# Patient Record
Sex: Female | Born: 2000 | Hispanic: Yes | Marital: Single | Smoking: Never smoker
Health system: Southern US, Community
[De-identification: ages and names within clinical notes are randomized; demographics above are authoritative.]

## PROBLEM LIST (undated history)

## (undated) DIAGNOSIS — I10 Essential (primary) hypertension: Secondary | ICD-10-CM

## (undated) DIAGNOSIS — F329 Major depressive disorder, single episode, unspecified: Secondary | ICD-10-CM

## (undated) DIAGNOSIS — F32A Depression, unspecified: Secondary | ICD-10-CM

## (undated) DIAGNOSIS — F4310 Post-traumatic stress disorder, unspecified: Secondary | ICD-10-CM

## (undated) HISTORY — DX: Essential (primary) hypertension: I10

## (undated) HISTORY — DX: Post-traumatic stress disorder, unspecified: F43.10

## (undated) HISTORY — DX: Depression, unspecified: F32.A

## (undated) HISTORY — PX: NO PAST SURGERIES: SHX2092

---
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
## 2019-01-21 NOTE — Progress Notes (Signed)
Chart abstracted per 01/20/19 phone interview with Tawanna Solo, RN; Debera Lat, RN

## 2019-01-24 ENCOUNTER — Ambulatory Visit (LOCAL_COMMUNITY_HEALTH_CENTER): Payer: Medicaid Other | Admitting: Family Medicine

## 2019-01-24 ENCOUNTER — Encounter: Payer: Self-pay | Admitting: Family Medicine

## 2019-01-24 ENCOUNTER — Other Ambulatory Visit: Payer: Self-pay

## 2019-01-24 VITALS — BP 131/67 | HR 87 | Temp 99.1°F | Wt 145.6 lb

## 2019-01-24 DIAGNOSIS — F329 Major depressive disorder, single episode, unspecified: Secondary | ICD-10-CM

## 2019-01-24 DIAGNOSIS — O219 Vomiting of pregnancy, unspecified: Secondary | ICD-10-CM | POA: Diagnosis not present

## 2019-01-24 DIAGNOSIS — O99340 Other mental disorders complicating pregnancy, unspecified trimester: Secondary | ICD-10-CM

## 2019-01-24 DIAGNOSIS — N926 Irregular menstruation, unspecified: Secondary | ICD-10-CM

## 2019-01-24 DIAGNOSIS — Z3400 Encounter for supervision of normal first pregnancy, unspecified trimester: Secondary | ICD-10-CM | POA: Diagnosis not present

## 2019-01-24 DIAGNOSIS — F32A Depression, unspecified: Secondary | ICD-10-CM | POA: Insufficient documentation

## 2019-01-24 LAB — URINALYSIS
Bilirubin, UA: NEGATIVE
Glucose, UA: NEGATIVE
Ketones, UA: NEGATIVE
Nitrite, UA: NEGATIVE
RBC, UA: NEGATIVE
Specific Gravity, UA: 1.03 (ref 1.005–1.030)
Urobilinogen, Ur: 0.2 mg/dL (ref 0.2–1.0)
pH, UA: 7 (ref 5.0–7.5)

## 2019-01-24 LAB — OB RESULTS CONSOLE HIV ANTIBODY (ROUTINE TESTING): HIV: NONREACTIVE

## 2019-01-24 LAB — HEMOGLOBIN, FINGERSTICK: Hemoglobin: 12 g/dL (ref 11.1–15.9)

## 2019-01-24 MED ORDER — PROMETHAZINE HCL 25 MG PO TABS: 25.0000 mg | ORAL_TABLET | Freq: Four times a day (QID) | ORAL | 0 refills | Status: DC | PRN

## 2019-01-24 MED ORDER — PROMETHAZINE HCL 25 MG PO TABS: 25.0000 mg | ORAL_TABLET | Freq: Four times a day (QID) | ORAL | 1 refills | Status: DC | PRN

## 2019-01-24 NOTE — Progress Notes (Signed)
In for new ob; has PNV; c/o nausea; agrees to flu vaccine today; initial labs today Debera Lat, RN Hgb WNL-informed will be called with u/s appt Debera Lat, RN Referred to [place], RN

## 2019-01-24 NOTE — Progress Notes (Signed)
[place] [id number] [phone]  INITIAL PRENATAL VISIT NOTE  Subjective:  [name] Black is a 18 y.o. G1P0 at [redacted]w[redacted]d being seen today to start prenatal care at the [facility] Department.  She is currently monitored for the following issues for this low-risk pregnancy and has Intentional drug overdose [facility]); MDD (major depressive disorder), single episode, severe , no psychosis ([place]); PTSD (post-traumatic stress disorder); Depression affecting pregnancy, antepartum; Supervision of normal first pregnancy, antepartum; and Nausea and vomiting during pregnancy on their problem list.  Patient reports nausea and vomiting.  Contractions: Not present. Vag. Bleeding: None.  Movement: Absent. Denies leaking of fluid.   The following portions of the patient's history were reviewed and updated as appropriate: allergies, current medications, past family history, past medical history, past social history, past surgical history and problem list. Problem list updated.  Objective:   Vitals:   01/24/19 1407  BP: 131/67  Pulse: 87  Temp: 99.1 F (37.3 C)  Weight: 145 lb 9.6 oz (66 kg)    Fetal Status: Fetal Heart Rate (bpm): 145 Fundal Height: 12 cm Movement: Absent     Physical Exam Vitals signs and nursing note reviewed.  Constitutional:      General: She is not in acute distress.    Appearance: Normal appearance. She is well-developed.  HENT:     Head: Normocephalic and atraumatic.     Right Ear: External ear normal.     Left Ear: External ear normal.     Nose: Nose normal. No congestion or rhinorrhea.     Mouth/Throat:     Lips: Pink.     Mouth: Mucous membranes are moist.     Dentition: Normal dentition. No dental caries.  Eyes:     General: No scleral icterus.    Conjunctiva/sclera: Conjunctivae normal.  Neck:     Thyroid: No thyroid mass or thyromegaly.  Cardiovascular:      Rate and Rhythm: Normal rate.     Pulses: Normal pulses.     Comments: Extremities are warm and well perfused Pulmonary:     Effort: Pulmonary effort is normal.     Breath sounds: Normal breath sounds.  Chest:     Breasts: Breasts are symmetrical.        Right: Normal. No mass, nipple discharge or skin change.        Left: Normal. No mass, nipple discharge or skin change.  Abdominal:     General: Abdomen is flat.     Palpations: Abdomen is soft.     Tenderness: There is no abdominal tenderness.     Comments: Gravid   Genitourinary:    General: Normal vulva.     Exam position: Lithotomy position.     Pubic Area: No rash.      Labia:        Right: No rash.        Left: No rash.      Vagina: Normal. No vaginal discharge.     Cervix: No cervical motion tenderness or friability.     Uterus: Normal. Enlarged (Gravid 12 wk). Not tender.      Adnexa: Right adnexa normal and left adnexa normal.     Rectum: Normal. No external hemorrhoid.  Musculoskeletal:     Right lower leg: No edema.     Left lower leg: No edema.  Lymphadenopathy:     Upper Body:     Right  upper body: No axillary adenopathy.     Left upper body: No axillary adenopathy.  Skin:    General: Skin is warm.     Capillary Refill: Capillary refill takes less than 2 seconds.  Neurological:     Mental Status: She is alert.     Assessment and Plan:  Pregnancy: G1P0 at [redacted]w[redacted]d  1. Supervision of normal first pregnancy, antepartum Reviewed genetic screenings- desires QUAD Last Dental visit was within the past year Excited about pregnancy, FOB came into room for FHT - Prenatal profile without Varicella/Rubella ([id number]) - QuantiFERON-TB Gold Plus - Lead, blood (adult age 44 yrs or greater) - Urine Culture - HIV [place] [facility] - Chlamydia/GC NAA, Confirmation - HGB FRAC. W/SOLUBILITY - promethazine (PHENERGAN) 25 MG tablet; Take 1 tablet (25 mg total) by mouth every 6 (six) hours as needed for nausea or vomiting.   Dispense: 30 tablet; Refill: 1  2. Depression affecting pregnancy, antepartum Happy today, recommend monitoring mood closely Low risk PHQ9 today (Score =1) Low threshold for referral to LCSW and recommend involvement early in pregnancy to help link to care if worsening mood pp.   3. Irregular periods/menstrual cycles - [place] MFM OB Comp Less 14 Wks; Future  4. Nausea and vomiting during pregnancy Phenergan #15  Recommended OTC meds for nausea Given [facility] supply of phenergan   Discussed overview of care and coordination with inpatient delivery practices including [facility], [facility], [facility] and [facility].   Reviewed Centering pregnancy as standard of care at [facility], oriented to room and showed video. Based on EDD, cycle 77-- continue to discuss enrollment. .    Preterm labor symptoms and general obstetric precautions including but not limited to vaginal bleeding, contractions, leaking of fluid and fetal movement were reviewed in detail with the patient.  Please refer to After Visit Summary for other counseling recommendations.   Return in about 4 weeks (around 02/21/2019) for Routine prenatal care.  Future Appointments  Date Time Provider Department Center  02/21/2019  2:00 PM [facility] PROVIDER [facility] None    Federico Flake, MD

## 2019-01-25 LAB — LEAD, BLOOD (ADULT >= 16 YRS): Lead-Whole Blood: <1 ug/dL (ref 0–4)

## 2019-01-26 ENCOUNTER — Encounter: Payer: Self-pay | Admitting: Advanced Practice Midwife

## 2019-01-26 DIAGNOSIS — O2340 Unspecified infection of urinary tract in pregnancy, unspecified trimester: Secondary | ICD-10-CM

## 2019-01-26 HISTORY — DX: Unspecified infection of urinary tract in pregnancy, unspecified trimester: O23.40

## 2019-01-26 LAB — URINE CULTURE

## 2019-01-27 LAB — CBC/D/PLT+RPR+RH+ABO+AB SCR
Antibody Screen: NEGATIVE
Basophils Absolute: 0 10*3/uL (ref 0.0–0.2)
Basos: 0 %
EOS (ABSOLUTE): 0.1 10*3/uL (ref 0.0–0.4)
Eos: 1 %
Hematocrit: 35.1 % (ref 34.0–46.6)
Hemoglobin: 12.2 g/dL (ref 11.1–15.9)
Hepatitis B Surface Ag: NEGATIVE
Immature Grans (Abs): 0 10*3/uL (ref 0.0–0.1)
Immature Granulocytes: 0 %
Lymphocytes Absolute: 1.7 10*3/uL (ref 0.7–3.1)
Lymphs: 19 %
MCH: 29.5 pg (ref 26.6–33.0)
MCHC: 34.8 g/dL (ref 31.5–35.7)
MCV: 85 fL (ref 79–97)
Monocytes Absolute: 0.6 10*3/uL (ref 0.1–0.9)
Monocytes: 6 %
Neutrophils Absolute: 6.6 10*3/uL (ref 1.4–7.0)
Neutrophils: 74 %
Platelets: 201 10*3/uL (ref 150–450)
RBC: 4.13 x10E6/uL (ref 3.77–5.28)
RDW: 12.3 % (ref 11.7–15.4)
RPR Ser Ql: NONREACTIVE
Rh Factor: POSITIVE
WBC: 9 10*3/uL (ref 3.4–10.8)

## 2019-01-27 LAB — QUANTIFERON-TB GOLD PLUS
QuantiFERON Mitogen Value: 3.71 IU/mL
QuantiFERON Nil Value: 0.38 IU/mL
QuantiFERON TB1 Ag Value: 4.23 IU/mL
QuantiFERON TB2 Ag Value: 4.38 IU/mL
QuantiFERON-TB Gold Plus: POSITIVE — AB

## 2019-01-27 LAB — HGB FRAC. W/SOLUBILITY
Hgb A2 Quant: 2.3 % (ref 1.8–3.2)
Hgb A: 97.7 % (ref 96.4–98.8)
Hgb C: 0 %
Hgb F Quant: 0 % (ref 0.0–2.0)
Hgb S: 0 %
Hgb Solubility: NEGATIVE
Hgb Variant: 0 %

## 2019-01-28 ENCOUNTER — Telehealth: Payer: Self-pay

## 2019-01-28 ENCOUNTER — Ambulatory Visit: Payer: Medicaid Other | Admitting: Advanced Practice Midwife

## 2019-01-28 ENCOUNTER — Other Ambulatory Visit: Payer: Self-pay

## 2019-01-28 VITALS — BP 137/87 | Temp 97.5°F | Wt 147.8 lb

## 2019-01-28 DIAGNOSIS — O2341 Unspecified infection of urinary tract in pregnancy, first trimester: Secondary | ICD-10-CM | POA: Diagnosis not present

## 2019-01-28 DIAGNOSIS — T50902A Poisoning by unspecified drugs, medicaments and biological substances, intentional self-harm, initial encounter: Secondary | ICD-10-CM

## 2019-01-28 DIAGNOSIS — F329 Major depressive disorder, single episode, unspecified: Secondary | ICD-10-CM

## 2019-01-28 DIAGNOSIS — Z3400 Encounter for supervision of normal first pregnancy, unspecified trimester: Secondary | ICD-10-CM | POA: Diagnosis not present

## 2019-01-28 DIAGNOSIS — O99340 Other mental disorders complicating pregnancy, unspecified trimester: Secondary | ICD-10-CM

## 2019-01-28 DIAGNOSIS — F32A Depression, unspecified: Secondary | ICD-10-CM

## 2019-01-28 LAB — CHLAMYDIA/GC NAA, CONFIRMATION
Chlamydia trachomatis, NAA: NEGATIVE
Neisseria gonorrhoeae, NAA: NEGATIVE

## 2019-01-28 MED ORDER — NITROFURANTOIN MONOHYD MACRO 100 MG PO CAPS: 100.0000 mg | ORAL_CAPSULE | Freq: Two times a day (BID) | ORAL | 0 refills | Status: AC

## 2019-01-28 NOTE — Telephone Encounter (Signed)
Call to client and counseled regarding need for UTI treatment. Appt scheduled for this pm in [facility]. Rich Number, RN

## 2019-01-28 NOTE — Progress Notes (Signed)
   PRENATAL VISIT NOTE  Subjective:  [name] Black is a 18 y.o. G1P0 at [redacted]w[redacted]d being seen today for ongoing prenatal care.  She is currently monitored for the following issues for this low-risk pregnancy and has Intentional drug overdose [facility]); MDD (major depressive disorder), single episode, severe , no psychosis ([place]); PTSD (post-traumatic stress disorder); Depression affecting pregnancy, antepartum; Supervision of normal first pregnancy, antepartum; Nausea and vomiting during pregnancy; and UTI (urinary tract infection) during pregnancy 01/24/19 E. Coli 10-25,000 on their problem list.  Patient reports no complaints.  Contractions: Not present. Vag. Bleeding: None.  Movement: Absent. Denies leaking of fluid/ROM.   The following portions of the patient's history were reviewed and updated as appropriate: allergies, current medications, past family history, past medical history, past social history, past surgical history and problem list. Problem list updated.  Objective:   Vitals:   01/28/19 1454  BP: 137/87  Temp: (!) 97.5 F (36.4 C)  Weight: 147 lb 12.8 oz (67 kg)    Fetal Status: Fetal Heart Rate (bpm): 160   Movement: Absent     General:  Alert, oriented and cooperative. Patient is in no acute distress.  Skin: Skin is warm and dry. No rash noted.   Cardiovascular: Normal heart rate noted  Respiratory: Normal respiratory effort, no problems with respiration noted  Abdomen: Soft, gravid, appropriate for gestational age.  Pain/Pressure: Absent     Pelvic: Cervical exam deferred        Extremities: Normal range of motion.  Edema: None  Mental Status: Normal mood and affect. Normal behavior. Normal judgment and thought content.   Assessment and Plan:  Pregnancy: G1P0 at [redacted]w[redacted]d  1. Depression affecting pregnancy, antepartum States at age 35 when had drug OD on mom's pills and was taken to ER, admitted x 5 days and then transferred to inpatient facility x 2 wks Did receive therapy  at age 6.  Declines need for therapy at present, but will let us know if needs.  Denies cigs.  Last MJ early 2020.  2. Urinary tract infection in mother during first trimester of pregnancy -CVAT.  Denies daily caffeine.  Given Macrobid 100 mg po BID x 7 days with instructions.   - nitrofurantoin, macrocrystal-monohydrate, (MACROBID) 100 MG capsule; Take 1 capsule (100 mg total) by mouth 2 (two) times daily for 7 days.  Dispense: 14 capsule; Refill: 0  3. Supervision of normal first pregnancy, antepartum Reminded of 01/31/19 u/s.  Not working.  Senior at [employer].  Living with FOB and his mom and mom's partner and 2 yo sister.  4. Intentional drug overdose, initial encounter ([place]) OD age 37 on mom's pills and in patient Last MJ early 2020.  Agrees to UDS today - [id number] Drug Screen   Preterm labor symptoms and general obstetric precautions including but not limited to vaginal bleeding, contractions, leaking of fluid and fetal movement were reviewed in detail with the patient. Please refer to After Visit Summary for other counseling recommendations.  Return in about 24 days (around 02/21/2019).  Future Appointments  Date Time Provider [place]  01/31/2019 11:00 AM WS-WS [place] 2 WS-IMG None  02/21/2019  2:00 PM [facility] PROVIDER [facility] None    Herbie Saxon, CNM

## 2019-01-28 NOTE — Progress Notes (Signed)
Here today for 12.0 week MH OB Prob. Patient needs UTI tx. Taking PNV QD, denies ED/hospital visits since last RV. Hal Morales, RN

## 2019-01-28 NOTE — Progress Notes (Signed)
Informed of [facility] dating scan for 01/31/2019 @ 11:00 (10:45.) Hal Morales, RN

## 2019-01-29 LAB — 789231 7+OXYCODONE-BUND
Amphetamines, Urine: NEGATIVE ng/mL
BENZODIAZ UR QL: NEGATIVE ng/mL
Barbiturate screen, urine: NEGATIVE ng/mL
Cannabinoid Quant, Ur: NEGATIVE ng/mL
Cocaine (Metab.): NEGATIVE ng/mL
OPIATE SCREEN URINE: NEGATIVE ng/mL
Oxycodone/Oxymorphone, Urine: NEGATIVE ng/mL
PCP Quant, Ur: NEGATIVE ng/mL

## 2019-01-31 ENCOUNTER — Other Ambulatory Visit: Payer: Self-pay

## 2019-01-31 ENCOUNTER — Ambulatory Visit (INDEPENDENT_AMBULATORY_CARE_PROVIDER_SITE_OTHER): Payer: Self-pay

## 2019-01-31 DIAGNOSIS — Z3A14 14 weeks gestation of pregnancy: Secondary | ICD-10-CM

## 2019-01-31 DIAGNOSIS — O3481 Maternal care for other abnormalities of pelvic organs, first trimester: Secondary | ICD-10-CM

## 2019-01-31 DIAGNOSIS — N926 Irregular menstruation, unspecified: Secondary | ICD-10-CM

## 2019-01-31 DIAGNOSIS — N8311 Corpus luteum cyst of right ovary: Secondary | ICD-10-CM

## 2019-02-01 ENCOUNTER — Encounter: Payer: Self-pay | Admitting: Family Medicine

## 2019-02-01 ENCOUNTER — Ambulatory Visit (LOCAL_COMMUNITY_HEALTH_CENTER): Payer: Medicaid Other

## 2019-02-01 ENCOUNTER — Telehealth: Payer: Self-pay

## 2019-02-01 VITALS — Ht 61.0 in | Wt 147.0 lb

## 2019-02-01 DIAGNOSIS — F329 Major depressive disorder, single episode, unspecified: Secondary | ICD-10-CM

## 2019-02-01 DIAGNOSIS — F32A Depression, unspecified: Secondary | ICD-10-CM

## 2019-02-01 DIAGNOSIS — Z3400 Encounter for supervision of normal first pregnancy, unspecified trimester: Secondary | ICD-10-CM

## 2019-02-01 DIAGNOSIS — R7612 Nonspecific reaction to cell mediated immunity measurement of gamma interferon antigen response without active tuberculosis: Secondary | ICD-10-CM | POA: Insufficient documentation

## 2019-02-01 NOTE — Progress Notes (Signed)
EPI and CXR ordered d/t +QGT.  EPI completed via phone; no consent signed. Discussed Active TB vs LTBI, tx regimen for LTBI and need for CXR. Patient states she will go for the CXR by Friday of this week. Aileen Fass, RN

## 2019-02-01 NOTE — Progress Notes (Signed)
EDD changed based on 14 wk scan at [facility]. Updated pregnancy episode to reflect new EDD.

## 2019-02-01 NOTE — Telephone Encounter (Signed)
TC to patient re: +QGT. See 02/01/19 EPI for documentation  Aileen Fass, RN

## 2019-02-02 ENCOUNTER — Ambulatory Visit
Admission: RE | Admit: 2019-02-02 | Discharge: 2019-02-02 | Disposition: A | Discharge status: Home / Self Care | Payer: Self-pay | Source: Ambulatory Visit | Attending: Family Medicine | Admitting: Family Medicine

## 2019-02-02 DIAGNOSIS — R7612 Nonspecific reaction to cell mediated immunity measurement of gamma interferon antigen response without active tuberculosis: Secondary | ICD-10-CM | POA: Insufficient documentation

## 2019-02-10 ENCOUNTER — Telehealth: Payer: Self-pay

## 2019-02-10 DIAGNOSIS — Z227 Latent tuberculosis: Secondary | ICD-10-CM

## 2019-02-10 NOTE — Telephone Encounter (Signed)
TC with patient.  Discussed normal CXR and LTBI tx.  Patient would like to proceed with LTBI tx during pregnancy.  Patient is ~[redacted] weeks gestation now. TB RN will f/u with patient closer to [redacted] weeks gestation and schedule start appt. Questions answered and patient verbalized understanding to call if any other concerns. Aileen Fass, RN

## 2019-02-21 ENCOUNTER — Ambulatory Visit: Payer: Self-pay | Admitting: Advanced Practice Midwife

## 2019-02-21 ENCOUNTER — Other Ambulatory Visit: Payer: Self-pay

## 2019-02-21 VITALS — BP 121/76 | Temp 98.0°F | Wt 147.2 lb

## 2019-02-21 DIAGNOSIS — Z3400 Encounter for supervision of normal first pregnancy, unspecified trimester: Secondary | ICD-10-CM

## 2019-02-21 DIAGNOSIS — F329 Major depressive disorder, single episode, unspecified: Secondary | ICD-10-CM

## 2019-02-21 DIAGNOSIS — F32A Depression, unspecified: Secondary | ICD-10-CM

## 2019-02-21 DIAGNOSIS — O99340 Other mental disorders complicating pregnancy, unspecified trimester: Secondary | ICD-10-CM

## 2019-02-21 DIAGNOSIS — O2342 Unspecified infection of urinary tract in pregnancy, second trimester: Secondary | ICD-10-CM

## 2019-02-21 LAB — URINALYSIS
Bilirubin, UA: NEGATIVE
Glucose, UA: NEGATIVE
Leukocytes,UA: NEGATIVE
Nitrite, UA: NEGATIVE
RBC, UA: NEGATIVE
Specific Gravity, UA: 1.025 (ref 1.005–1.030)
Urobilinogen, Ur: 0.2 mg/dL (ref 0.2–1.0)
pH, UA: 6.5 (ref 5.0–7.5)

## 2019-02-21 NOTE — Progress Notes (Signed)
   PRENATAL VISIT NOTE  Subjective:  [name] Black is a 18 y.o. G1P0 at [redacted]w[redacted]d being seen today for ongoing prenatal care.  She is currently monitored for the following issues for this low-risk pregnancy and has Intentional drug overdose [facility]) age 18 on mom's rx--inpt [facility] hospital x 5 days; MDD (major depressive disorder), single episode, severe , no psychosis ([place]); PTSD (post-traumatic stress disorder); Depression affecting pregnancy, antepartum; Supervision of normal first pregnancy, antepartum; Nausea and vomiting during pregnancy; UTI (urinary tract infection) during pregnancy 01/24/19 E. 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  Patient reports no complaints.  Contractions: Not present. Vag. Bleeding: None.  Movement: Present. Denies leaking of fluid/ROM.   The following portions of the patient's history were reviewed and updated as appropriate: allergies, current medications, past family history, past medical history, past social history, past surgical history and problem list. Problem list updated.  Objective:   Vitals:   02/21/19 1416  BP: 121/76  Temp: 98 F (36.7 C)  Weight: 147 lb 3.2 oz (66.8 kg)    Fetal Status: Fetal Heart Rate (bpm): 150 Fundal Height: 16 cm Movement: Present     General:  Alert, oriented and cooperative. Patient is in no acute distress.  Skin: Skin is warm and dry. No rash noted.   Cardiovascular: Normal heart rate noted  Respiratory: Normal respiratory effort, no problems with respiration noted  Abdomen: Soft, gravid, appropriate for gestational age.  Pain/Pressure: Absent     Pelvic: Cervical exam deferred        Extremities: Normal range of motion.  Edema: None  Mental Status: Normal mood and affect. Normal behavior. Normal judgment and thought content.   Assessment and Plan:  Pregnancy: G1P0 at [redacted]w[redacted]d  1. Depression affecting pregnancy, antepartum Denies need for [place], LCSW and denies sxs of depression; states she is doing better now  2. UTI 01/28/19 TOC C&S today - Urine Culture & Sensitivity  3. Supervision of normal first pregnancy, antepartum Quad screen done today.  Reviewed 01/31/19 u/s at 14.0 wks.  Anatomy u/s ordered - QUAD Screen [facility] Only - Urinalysis (Urine Dip)   Preterm labor symptoms and general obstetric precautions including but not limited to vaginal bleeding, contractions, leaking of fluid and fetal movement were reviewed in detail with the patient. Please refer to After Visit Summary for other counseling recommendations.  No follow-ups on file.  No future appointments.  Herbie Saxon, CNM

## 2019-02-21 NOTE — Progress Notes (Signed)
Here today for 17.0 week MH RV. Taking PNV QD, denies ED/hospital visits since last RV. Desires Quad screen. Needs Anatomy Scan. Hal Morales, RN

## 2019-02-22 ENCOUNTER — Other Ambulatory Visit: Payer: Self-pay | Admitting: Advanced Practice Midwife

## 2019-02-22 ENCOUNTER — Telehealth: Payer: Self-pay

## 2019-02-22 DIAGNOSIS — Z3402 Encounter for supervision of normal first pregnancy, second trimester: Secondary | ICD-10-CM

## 2019-02-22 NOTE — Telephone Encounter (Signed)
Referral written by provider for anatomy US at [facility] at 18 - [redacted] weeks EGA. Client had dating [place] at [facility] 01/31/2019. Per Levon Hedger, Finance at [facility], client does not have Medicaid. Per [facility],  Tracks indicates had MPW in 01/2019 when they did her [place], but no longer has MPW. Phone call to client to ascertain if desires [place] to be scheduled at [facility] or [facility]. Per client, she needs it scheduled where her presumptive Medicaid will cover the cost (counseled regarding above). As unable to schedule at [facility] as no Medicaid and they will not accept presumptive Medicaid, referral faxed to Outpatient Scheduling for [facility] appt. Appt scheduled for 03/08/2019 at 1:45 pm with arrival time of 1:30 pm. Client counseled to drink 4 glasses of water by 1:15 pm and to not use bathroom if at all possible as best to have full bladder for [place]. [place] scheduled at [place] and client provided address. Client verbalized understanding of above. Rich Number, RN

## 2019-02-23 LAB — URINE CULTURE: Organism ID, Bacteria: NO GROWTH

## 2019-03-08 ENCOUNTER — Other Ambulatory Visit: Payer: Self-pay

## 2019-03-09 ENCOUNTER — Other Ambulatory Visit: Payer: Self-pay

## 2019-03-09 ENCOUNTER — Ambulatory Visit
Admission: RE | Admit: 2019-03-09 | Discharge: 2019-03-09 | Disposition: A | Discharge status: Home / Self Care | Payer: Self-pay | Source: Ambulatory Visit | Attending: Advanced Practice Midwife | Admitting: Advanced Practice Midwife

## 2019-03-09 DIAGNOSIS — Z3402 Encounter for supervision of normal first pregnancy, second trimester: Secondary | ICD-10-CM | POA: Insufficient documentation

## 2019-03-10 ENCOUNTER — Encounter: Payer: Self-pay | Admitting: Advanced Practice Midwife

## 2019-03-11 NOTE — L&D Delivery Note (Signed)
Obstetrical Delivery Note   Date of Delivery:   07/06/2019 Primary OB:   [facility] Gestational Age/EDD: [redacted]w[redacted]d (Dated by 14 wk [place]) Antepartum complications: PPROM/ premature labor at [facility]  Delivered By:   Farrel Conners, CNM  Delivery Type:   spontaneous vaginal delivery  Procedure Details:   Mother with rectal pressure. Cervix C/C/+2. Mother pushed to deliver a vigorous female infant in OA with a leg cord x 1. Baby dried and placed on mother's abdomen. After delayed cord clamping the cord was cut by the FOB. Baby then placed skin to skin with mother. Apgars 8/9. Spontaneous delivery of intact placenta and 3 vessel cord. Continued oozing prompted evacuation of clots from lower uterine segment. Right labial/ periclitoral laceration repaired with 3-0 Chromic with Red Robnel catheter placed in urethra. First degree perineal laceration also repaired with 3-0 Chromic. EBL 300 ml. Anesthesia:    epidural Intrapartum complications: Meconium stained amniotic fluid GBS:    Neg PCR GBS, but treated with PCN PPX since <37 weeks (was adequately treated). Laceration:    Right labial/ periclitoral laceration and first degree perineal laceration Episiotomy:    none Placenta:    Via active 3rd stage. To pathology: no Estimated Blood Loss:  300 ml Baby:    Liveborn female, Apgars 8/9, weight pending    Farrel Conners, CNM

## 2019-03-16 ENCOUNTER — Telehealth: Payer: Self-pay

## 2019-03-16 NOTE — Telephone Encounter (Signed)
TC with patient. Discussed LTBI tx now that patient is [redacted] weeks gestation.  Patient would like to wait to start tx after delivery.  TB RN will f/u at that time.Richmond Campbell, RN

## 2019-03-21 ENCOUNTER — Ambulatory Visit: Payer: Self-pay

## 2019-03-22 ENCOUNTER — Ambulatory Visit: Payer: Self-pay | Admitting: Family Medicine

## 2019-03-22 ENCOUNTER — Other Ambulatory Visit: Payer: Self-pay

## 2019-03-22 DIAGNOSIS — Z5321 Procedure and treatment not carried out due to patient leaving prior to being seen by health care provider: Secondary | ICD-10-CM

## 2019-03-22 NOTE — Progress Notes (Signed)
I did not see pt but was advised of situation by RN. I agree with RN note.

## 2019-03-22 NOTE — Progress Notes (Signed)
Pt states her partner tested positive for Covid and was released from isolation on 03/18/2019; states she has been around her partner the whole time he was on isolation. Confirmed with Covid staff that this pt is supposed to be isolated until 03/31/2019. Pt given date for the end of her isolation, told her she should be tested, and escorted out of building. Pt not complaining of any issues related to Covid or pregnancy.

## 2019-04-20 ENCOUNTER — Other Ambulatory Visit: Payer: Self-pay

## 2019-04-20 ENCOUNTER — Ambulatory Visit: Payer: Self-pay | Admitting: Advanced Practice Midwife

## 2019-04-20 VITALS — BP 126/75 | HR 107 | Temp 97.9°F | Wt 157.6 lb

## 2019-04-20 DIAGNOSIS — F329 Major depressive disorder, single episode, unspecified: Secondary | ICD-10-CM

## 2019-04-20 DIAGNOSIS — O99340 Other mental disorders complicating pregnancy, unspecified trimester: Secondary | ICD-10-CM

## 2019-04-20 DIAGNOSIS — Z3400 Encounter for supervision of normal first pregnancy, unspecified trimester: Secondary | ICD-10-CM

## 2019-04-20 MED ORDER — PRENATAL VITAMIN 27-0.8 MG PO TABS: 1.0000 | ORAL_TABLET | Freq: Every day | ORAL | 0 refills | Status: DC

## 2019-04-20 NOTE — Progress Notes (Signed)
   PRENATAL VISIT NOTE  Subjective:  [name] Black is a 19 y.o. G1P0 at [redacted]w[redacted]d being seen today for ongoing prenatal care.  She is currently monitored for the following issues for this low-risk pregnancy and has Intentional drug overdose [facility]) age 51 on mom's rx--inpt [facility] hospital x 5 days; MDD (major depressive disorder), single episode, severe , no psychosis (HCC); PTSD (post-traumatic stress disorder); Depression affecting pregnancy, antepartum; Supervision of normal first pregnancy, antepartum; Nausea and vomiting during pregnancy; UTI (urinary tract infection) during pregnancy 01/24/19 E. 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  Patient reports no complaints.  Contractions: Not present. Vag. Bleeding: None.  Movement: Absent. Denies leaking of fluid/ROM.   The following portions of the patient's history were reviewed and updated as appropriate: allergies, current medications, past family history, past medical history, past social history, past surgical history and problem list. Problem list updated.  Objective:   Vitals:   04/20/19 1333  BP: 126/75  Pulse: (!) 107  Temp: 97.9 F (36.6 C)  Weight: 157 lb 9.6 oz (71.5 kg)    Fetal Status: Fetal Heart Rate (bpm): 140 Fundal Height: 26 cm Movement: Absent     General:  Alert, oriented and cooperative. Patient is in no acute distress.  Skin: Skin is warm and dry. No rash noted.   Cardiovascular: Normal heart rate noted  Respiratory: Normal respiratory effort, no problems with respiration noted  Abdomen: Soft, gravid, appropriate for gestational age.  Pain/Pressure: Absent     Pelvic: Cervical exam deferred        Extremities: Normal range of motion.  Edema: None  Mental Status: Normal mood and affect. Normal behavior. Normal judgment and thought content.   Assessment and Plan:  Pregnancy: G1P0 at [redacted]w[redacted]d  1. Depression affecting pregnancy, antepartum PHQ-9=2.   Denies sxs depression or need for counseling  2. Supervision of normal first pregnancy, antepartum Feels well.  Not working and not in school.  Living with partner and his mom.  Quad screen neg.     Preterm labor symptoms and general obstetric precautions including but not limited to vaginal bleeding, contractions, leaking of fluid and fetal movement were reviewed in detail with the patient. Please refer to After Visit Summary for other counseling recommendations.  Return in about 3 weeks (around 05/11/2019).  No future appointments.  Alberteen Spindle, CNM

## 2019-04-20 NOTE — Addendum Note (Signed)
Addended by: Burt Knack on: 04/20/2019 02:30 PM   Modules accepted: Orders

## 2019-04-20 NOTE — Progress Notes (Signed)
Patient here for MH RV at 25 2/7. Still undecided about BCM and Ped. Information given and patient counseled to make a plan for both. CCNC and PHQ9 today. S/S PTL reviewed and literature given.Burt Knack, RN

## 2019-04-29 NOTE — Progress Notes (Signed)
Chart reviewed by Pharmacist  Suzanne Walker PharmD, Contract Pharmacist at [facility]  

## 2019-05-11 ENCOUNTER — Encounter: Payer: Self-pay | Admitting: Advanced Practice Midwife

## 2019-05-11 ENCOUNTER — Ambulatory Visit: Payer: Self-pay | Admitting: Advanced Practice Midwife

## 2019-05-11 ENCOUNTER — Other Ambulatory Visit: Payer: Self-pay

## 2019-05-11 VITALS — BP 115/77 | HR 109 | Temp 98.4°F | Wt 160.0 lb

## 2019-05-11 DIAGNOSIS — O99019 Anemia complicating pregnancy, unspecified trimester: Secondary | ICD-10-CM

## 2019-05-11 DIAGNOSIS — F329 Major depressive disorder, single episode, unspecified: Secondary | ICD-10-CM

## 2019-05-11 DIAGNOSIS — Z3400 Encounter for supervision of normal first pregnancy, unspecified trimester: Secondary | ICD-10-CM

## 2019-05-11 DIAGNOSIS — O99013 Anemia complicating pregnancy, third trimester: Secondary | ICD-10-CM

## 2019-05-11 DIAGNOSIS — O99340 Other mental disorders complicating pregnancy, unspecified trimester: Secondary | ICD-10-CM

## 2019-05-11 DIAGNOSIS — F32A Depression, unspecified: Secondary | ICD-10-CM

## 2019-05-11 HISTORY — DX: Anemia complicating pregnancy, third trimester: O99.013

## 2019-05-11 LAB — HIV ANTIBODY (ROUTINE TESTING W REFLEX): HIV 1&2 Ab, 4th Generation: NONREACTIVE

## 2019-05-11 LAB — HEMOGLOBIN, FINGERSTICK: Hemoglobin: 10.9 g/dL — ABNORMAL LOW (ref 11.1–15.9)

## 2019-05-11 MED ORDER — FERROUS SULFATE 325 (65 FE) MG PO TABS: 325.0000 mg | ORAL_TABLET | Freq: Every day | ORAL | 0 refills | Status: DC

## 2019-05-11 NOTE — Addendum Note (Signed)
Addended by: Sharlette Dense on: 05/11/2019 03:41 PM   Modules accepted: Orders

## 2019-05-11 NOTE — Progress Notes (Signed)
In for visit; denies hospital visits; taking PNV; Peggyann Juba, RPR, HIV today; agrees to Tdap Sharlette Dense, RN

## 2019-05-11 NOTE — Progress Notes (Signed)
   PRENATAL VISIT NOTE  Subjective:  [name] Black is a 19 y.o. G1P0 at [redacted]w[redacted]d being seen today for ongoing prenatal care.  She is currently monitored for the following issues for this low-risk pregnancy and has Intentional drug overdose [facility]) age 57 on mom's rx--inpt [facility] hospital x 5 days; MDD (major depressive disorder), single episode, severe , no psychosis (HCC); PTSD (post-traumatic stress disorder); Depression affecting pregnancy, antepartum; Supervision of normal first pregnancy, antepartum; Nausea and vomiting during pregnancy; UTI (urinary tract infection) during pregnancy 01/24/19 E. 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  Patient reports no complaints.  Contractions: Not present. Vag. Bleeding: None.  Movement: Absent. Denies leaking of fluid/ROM.   The following portions of the patient's history were reviewed and updated as appropriate: allergies, current medications, past family history, past medical history, past social history, past surgical history and problem list. Problem list updated.  Objective:   Vitals:   05/11/19 1423  BP: 115/77  Pulse: (!) 109  Temp: 98.4 F (36.9 C)  Weight: 160 lb (72.6 kg)    Fetal Status: Fetal Heart Rate (bpm): 150 Fundal Height: 28 cm Movement: Absent     General:  Alert, oriented and cooperative. Patient is in no acute distress.  Skin: Skin is warm and dry. No rash noted.   Cardiovascular: Normal heart rate noted  Respiratory: Normal respiratory effort, no problems with respiration noted  Abdomen: Soft, gravid, appropriate for gestational age.  Pain/Pressure: Absent     Pelvic: Cervical exam deferred        Extremities: Normal range of motion.  Edema: None  Mental Status: Normal mood and affect. Normal behavior. Normal judgment and thought content.   Assessment and Plan:  Pregnancy: G1P0 at [redacted]w[redacted]d  1. Depression affecting pregnancy, antepartum Denies sxs  depression  2. Supervision of normal first pregnancy, antepartum Feels well.  Not working - Glucose, 1 hour gestational - RPR - [facility] - Hemoglobin, venipuncture   Preterm labor symptoms and general obstetric precautions including but not limited to vaginal bleeding, contractions, leaking of fluid and fetal movement were reviewed in detail with the patient. Please refer to After Visit Summary for other counseling recommendations.  No follow-ups on file.  No future appointments.  Alberteen Spindle, CNM

## 2019-05-11 NOTE — Addendum Note (Signed)
Addended by: Tawny Hopping A on: 05/11/2019 03:32 PM   Modules accepted: Orders

## 2019-05-12 LAB — FE+CBC/D/PLT+TIBC+FER+RETIC
Basophils Absolute: 0 10*3/uL (ref 0.0–0.2)
Basos: 0 %
EOS (ABSOLUTE): 0.1 10*3/uL (ref 0.0–0.4)
Eos: 1 %
Ferritin: 30 ng/mL (ref 15–77)
Hematocrit: 32.4 % — ABNORMAL LOW (ref 34.0–46.6)
Hemoglobin: 11 g/dL — ABNORMAL LOW (ref 11.1–15.9)
Immature Grans (Abs): 0.1 10*3/uL (ref 0.0–0.1)
Immature Granulocytes: 1 %
Iron Saturation: 10 % — ABNORMAL LOW (ref 15–55)
Iron: 46 ug/dL (ref 27–159)
Lymphocytes Absolute: 1.5 10*3/uL (ref 0.7–3.1)
Lymphs: 16 %
MCH: 30.1 pg (ref 26.6–33.0)
MCHC: 34 g/dL (ref 31.5–35.7)
MCV: 89 fL (ref 79–97)
Monocytes Absolute: 0.6 10*3/uL (ref 0.1–0.9)
Monocytes: 7 %
Neutrophils Absolute: 6.8 10*3/uL (ref 1.4–7.0)
Neutrophils: 75 %
Platelets: 270 10*3/uL (ref 150–450)
RBC: 3.65 x10E6/uL — ABNORMAL LOW (ref 3.77–5.28)
RDW: 12.5 % (ref 11.7–15.4)
Retic Ct Pct: 2.8 % — ABNORMAL HIGH (ref 0.6–2.6)
Total Iron Binding Capacity: 441 ug/dL (ref 250–450)
UIBC: 395 ug/dL (ref 131–425)
WBC: 9 10*3/uL (ref 3.4–10.8)

## 2019-05-12 LAB — GLUCOSE, 1 HOUR GESTATIONAL: Gestational Diabetes Screen: 95 mg/dL (ref 65–139)

## 2019-05-12 LAB — RPR: RPR Ser Ql: NONREACTIVE

## 2019-05-18 NOTE — Progress Notes (Signed)
HIV results abstracted. Kayleeann Huxford, RN  

## 2019-05-25 ENCOUNTER — Ambulatory Visit: Payer: Self-pay | Admitting: Family Medicine

## 2019-05-25 ENCOUNTER — Other Ambulatory Visit: Payer: Self-pay

## 2019-05-25 ENCOUNTER — Encounter: Payer: Self-pay | Admitting: Family Medicine

## 2019-05-25 DIAGNOSIS — Z3400 Encounter for supervision of normal first pregnancy, unspecified trimester: Secondary | ICD-10-CM

## 2019-05-25 DIAGNOSIS — O99013 Anemia complicating pregnancy, third trimester: Secondary | ICD-10-CM

## 2019-05-25 DIAGNOSIS — F32A Depression, unspecified: Secondary | ICD-10-CM

## 2019-05-25 DIAGNOSIS — F329 Major depressive disorder, single episode, unspecified: Secondary | ICD-10-CM

## 2019-05-25 DIAGNOSIS — R7612 Nonspecific reaction to cell mediated immunity measurement of gamma interferon antigen response without active tuberculosis: Secondary | ICD-10-CM

## 2019-05-25 NOTE — Progress Notes (Signed)
  PRENATAL VISIT NOTE  Subjective:  [name] Black is a 19 y.o. G1P0 at [redacted]w[redacted]d being seen today for ongoing prenatal care.  She is currently monitored for the following issues for this high-risk pregnancy and has Intentional drug overdose [facility]) age 29 on mom's rx--inpt [facility] hospital x 5 days; MDD (major depressive disorder), single episode, severe , no psychosis (HCC); PTSD (post-traumatic stress disorder); Depression affecting pregnancy, antepartum; Supervision of normal first pregnancy, antepartum; Nausea and vomiting during pregnancy; UTI (urinary tract infection) during pregnancy 01/24/19 E. 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  Patient reports no complaints.  Contractions: Not present. Vag. Bleeding: None.  Movement: Present. Denies leaking of fluid/ROM.   The following portions of the patient's history were reviewed and updated as appropriate: allergies, current medications, past family history, past medical history, past social history, past surgical history and problem list. Problem list updated.  Objective:   Vitals:   05/25/19 1346  BP: 117/63  Pulse: 100  Temp: 97.7 F (36.5 C)  Weight: 161 lb 9.6 oz (73.3 kg)    Fetal Status: Fetal Heart Rate (bpm): 150 Fundal Height: 30 cm Movement: Present     General:  Alert, oriented and cooperative. Patient is in no acute distress. Cheerful.  Skin: Skin is warm and dry. No rash noted.   Cardiovascular: Normal heart rate noted  Respiratory: Normal respiratory effort, no problems with respiration noted  Abdomen: Soft, gravid, appropriate for gestational age.  Pain/Pressure: Absent     Pelvic: Cervical exam deferred        Extremities: Normal range of motion.  Edema: None  Mental Status: Normal mood and affect. Normal behavior. Normal judgment and thought content.   Assessment and Plan:  Pregnancy: G1P0 at [redacted]w[redacted]d    1.  Supervision of normal first pregnancy, antepartum -Up to date. Reviewed 28 wk labs - wnl except Hgb.  2. Anemia in pregnancy, third trimester -She is taking iron daily. I advised to take w/OJ instead of water and to take separately from PNV. Recheck at next visit.  3. Depression affecting pregnancy, antepartum -States mood is good these days.  4. 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    Preterm labor symptoms and general obstetric precautions including but not limited to vaginal bleeding, contractions, leaking of fluid and fetal movement were reviewed in detail with the patient. Please refer to After Visit Summary for other counseling recommendations.  Return in about 2 weeks (around 06/08/2019) for routine prenatal care.  Future Appointments  Date Time Provider Department Center  06/08/2019  3:00 PM [facility] PROVIDER [facility] None    Ann Held, PA-C

## 2019-05-25 NOTE — Progress Notes (Signed)
In for visit; taking PNV & Fe; denies hospital visits since last appt; undecided on [facility] Sharlette Dense, RN

## 2019-06-02 NOTE — Addendum Note (Signed)
Addended by: Heywood Bene on: 06/02/2019 11:33 AM   Modules accepted: Orders

## 2019-06-08 ENCOUNTER — Ambulatory Visit: Payer: Self-pay

## 2019-06-13 ENCOUNTER — Other Ambulatory Visit: Payer: Self-pay

## 2019-06-13 ENCOUNTER — Encounter: Payer: Self-pay | Admitting: Family Medicine

## 2019-06-13 ENCOUNTER — Ambulatory Visit: Payer: Self-pay | Admitting: Family Medicine

## 2019-06-13 ENCOUNTER — Ambulatory Visit: Payer: Self-pay

## 2019-06-13 VITALS — BP 110/68 | HR 85 | Temp 97.2°F | Wt 161.0 lb

## 2019-06-13 DIAGNOSIS — F329 Major depressive disorder, single episode, unspecified: Secondary | ICD-10-CM

## 2019-06-13 DIAGNOSIS — Z3400 Encounter for supervision of normal first pregnancy, unspecified trimester: Secondary | ICD-10-CM

## 2019-06-13 DIAGNOSIS — O99340 Other mental disorders complicating pregnancy, unspecified trimester: Secondary | ICD-10-CM

## 2019-06-13 DIAGNOSIS — R7612 Nonspecific reaction to cell mediated immunity measurement of gamma interferon antigen response without active tuberculosis: Secondary | ICD-10-CM

## 2019-06-13 DIAGNOSIS — O99013 Anemia complicating pregnancy, third trimester: Secondary | ICD-10-CM

## 2019-06-13 LAB — HEMOGLOBIN, FINGERSTICK: Hemoglobin: 11.4 g/dL (ref 11.1–15.9)

## 2019-06-13 NOTE — Progress Notes (Signed)
   PRENATAL VISIT NOTE  Subjective:  [name] Black is a 19 y.o. G1P0 at [redacted]w[redacted]d being seen today for ongoing prenatal care.  She is currently monitored for the following issues for this low-risk pregnancy and has Intentional drug overdose [facility]) age 37 on mom's rx--inpt [facility] hospital x 5 days; MDD (major depressive disorder), single episode, severe , no psychosis (HCC); PTSD (post-traumatic stress disorder); Depression affecting pregnancy, antepartum; Supervision of normal first pregnancy, antepartum; Nausea and vomiting during pregnancy; UTI (urinary tract infection) during pregnancy 01/24/19 E. 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  Patient reports no complaints.  Contractions: Not present. Vag. Bleeding: None.  Movement: Present. Denies leaking of fluid/ROM.   The following portions of the patient's history were reviewed and updated as appropriate: allergies, current medications, past family history, past medical history, past social history, past surgical history and problem list. Problem list updated.  Objective:   Vitals:   06/13/19 0911  BP: 110/68  Pulse: 85  Temp: (!) 97.2 F (36.2 C)  Weight: 161 lb (73 kg)    Fetal Status: Fetal Heart Rate (bpm): 145 Fundal Height: 33 cm Movement: Present  Presentation: Vertex  General:  Alert, oriented and cooperative. Patient is in no acute distress.  Skin: Skin is warm and dry. No rash noted.   Cardiovascular: Normal heart rate noted  Respiratory: Normal respiratory effort, no problems with respiration noted  Abdomen: Soft, gravid, appropriate for gestational age.  Pain/Pressure: Absent     Pelvic: Cervical exam deferred        Extremities: Normal range of motion.  Edema: None  Mental Status: Normal mood and affect. Normal behavior. Normal judgment and thought content.   Assessment and Plan:  Pregnancy: G1P0 at [redacted]w[redacted]d  1.  Anemia in pregnancy, third trimester Taking Fe, will check hgb today - Hemoglobin, venipuncture  2. Depression affecting pregnancy, antepartum Declined LCSW involvement previously Interactive today, making good eye contact. Mood stable- will get PHQ9 at 35-36 wks. Consider referral to LCSW for 2 wk pp mood check given risk of postpartum depression  3. Supervision of normal first pregnancy, antepartum Up to date currently Reviewed she plans on feeding breastmilk and formula, involved with WIC Discussed desire for more children in 5+ years, unsure about contraception at this point.  Provided some resources and encouraged discussion at next visit At next visit-- discuss use of a doula/labor support  4. 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 Please refer to After Visit Summary for other counseling recommendations.   Return in about 2 weeks (around 06/27/2019) for Routine prenatal care.  Future Appointments  Date Time Provider Department Center  06/27/2019  2:00 PM [facility] PROVIDER [facility] None    Federico Flake, MD

## 2019-06-13 NOTE — Progress Notes (Signed)
Here today for 33.0 week MH RV. Taking PNV and Iron QD. Denies any ED/hospital visits since last RV. Kick Count cards and instructions given and explained. Recheck Hgb today. Tawny Hopping, RN

## 2019-06-27 ENCOUNTER — Ambulatory Visit: Payer: Self-pay | Admitting: Family Medicine

## 2019-06-27 ENCOUNTER — Other Ambulatory Visit: Payer: Self-pay

## 2019-06-27 ENCOUNTER — Encounter: Payer: Self-pay | Admitting: Family Medicine

## 2019-06-27 VITALS — BP 117/71 | HR 94 | Temp 97.3°F | Wt 163.6 lb

## 2019-06-27 DIAGNOSIS — F32A Depression, unspecified: Secondary | ICD-10-CM

## 2019-06-27 DIAGNOSIS — O26843 Uterine size-date discrepancy, third trimester: Secondary | ICD-10-CM | POA: Insufficient documentation

## 2019-06-27 DIAGNOSIS — Z3400 Encounter for supervision of normal first pregnancy, unspecified trimester: Secondary | ICD-10-CM

## 2019-06-27 DIAGNOSIS — F329 Major depressive disorder, single episode, unspecified: Secondary | ICD-10-CM

## 2019-06-27 DIAGNOSIS — R7612 Nonspecific reaction to cell mediated immunity measurement of gamma interferon antigen response without active tuberculosis: Secondary | ICD-10-CM

## 2019-06-27 DIAGNOSIS — O99013 Anemia complicating pregnancy, third trimester: Secondary | ICD-10-CM

## 2019-06-27 NOTE — Progress Notes (Signed)
  PRENATAL VISIT NOTE  Subjective:  [name] Black is a 19 y.o. G1P0 at [redacted]w[redacted]d being seen today for ongoing prenatal care.  She is currently monitored for the following issues for this high-risk pregnancy and has Intentional drug overdose [facility]) age 50 on mom's rx--inpt [facility] hospital x 5 days; MDD (major depressive disorder), single episode, severe , no psychosis (HCC); PTSD (post-traumatic stress disorder); Depression affecting pregnancy, antepartum; Supervision of normal first pregnancy, antepartum; Nausea and vomiting during pregnancy; UTI (urinary tract infection) during pregnancy 01/24/19 E. 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  Patient reports occ lower abd pains w/standing up in mornings.  Contractions: Not present. Vag. Bleeding: None.  Movement: Present. Denies leaking of fluid/ROM.   The following portions of the patient's history were reviewed and updated as appropriate: allergies, current medications, past family history, past medical history, past social history, past surgical history and problem list. Problem list updated.  Objective:   Vitals:   06/27/19 1348  BP: 117/71  Pulse: 94  Temp: (!) 97.3 F (36.3 C)  Weight: 163 lb 9.6 oz (74.2 kg)    Fetal Status: Fetal Heart Rate (bpm): 135 Fundal Height: 32 cm Movement: Present     General:  Alert, oriented and cooperative. Patient is in no acute distress.  Skin: Skin is warm and dry. No rash noted.   Cardiovascular: Normal heart rate noted  Respiratory: Normal respiratory effort, no problems with respiration noted  Abdomen: Soft, gravid, appropriate for gestational age.  Pain/Pressure: Absent     Pelvic: Cervical exam deferred        Extremities: Normal range of motion.  Edema: None  Mental Status: Normal mood and affect. Normal behavior. Normal judgment and thought  content.   Assessment and Plan:  Pregnancy: G1P0 at [redacted]w[redacted]d   1. Supervision of normal first pregnancy, antepartum -Up to date. Discussed q1 wk visits from now on. -Extensive discussion on BCM today, recommended 18 mo between pregnancies. Pt is unsure of pp BCM. [employer] website given.  -Pt is interested in a doula at [facility]. I will message Diona Foley.   2. Uterine size-date discrepency -Measuring small, denies leaking of fluid. I have referred for [place] today. Pt knows to expect phone call w/appt.   3. Anemia in pregnancy, third trimester -Taking iron daily, hgb 06/13/19 = 11.4. Advised to continue  4. Depression affecting pregnancy, antepartum -States mood is great today.   5. 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    Preterm labor symptoms and general obstetric precautions including but not limited to vaginal bleeding, contractions, leaking of fluid and fetal movement were reviewed in detail with the patient. Please refer to After Visit Summary for other counseling recommendations.  Return in about 1 week (around 07/04/2019) for routine prenatal care.  Future Appointments  Date Time Provider Department Center  07/04/2019  2:00 PM [facility] PROVIDER [facility] None    Ann Held, PA-C

## 2019-06-27 NOTE — Progress Notes (Addendum)
Here today for 35.0 week MH RV. Taking PNV and Iron QD, Denies ED/hospital visits since last RV. Undecided regarding PP BCM. Tawny Hopping, RN

## 2019-06-29 ENCOUNTER — Ambulatory Visit: Payer: Self-pay

## 2019-06-29 ENCOUNTER — Other Ambulatory Visit: Payer: Self-pay | Admitting: Obstetrics and Gynecology

## 2019-06-29 ENCOUNTER — Other Ambulatory Visit: Payer: Self-pay

## 2019-06-29 DIAGNOSIS — Z3689 Encounter for other specified antenatal screening: Secondary | ICD-10-CM

## 2019-06-29 DIAGNOSIS — Z3A34 34 weeks gestation of pregnancy: Secondary | ICD-10-CM

## 2019-07-04 ENCOUNTER — Ambulatory Visit: Payer: Self-pay

## 2019-07-05 ENCOUNTER — Ambulatory Visit: Payer: Self-pay | Admitting: Family Medicine

## 2019-07-05 ENCOUNTER — Other Ambulatory Visit: Payer: Self-pay

## 2019-07-05 VITALS — BP 113/74 | HR 107 | Temp 98.2°F | Wt 163.6 lb

## 2019-07-05 DIAGNOSIS — F329 Major depressive disorder, single episode, unspecified: Secondary | ICD-10-CM

## 2019-07-05 DIAGNOSIS — Z3400 Encounter for supervision of normal first pregnancy, unspecified trimester: Secondary | ICD-10-CM

## 2019-07-05 DIAGNOSIS — O99013 Anemia complicating pregnancy, third trimester: Secondary | ICD-10-CM

## 2019-07-05 DIAGNOSIS — F32A Depression, unspecified: Secondary | ICD-10-CM

## 2019-07-05 DIAGNOSIS — L299 Pruritus, unspecified: Secondary | ICD-10-CM

## 2019-07-05 NOTE — Progress Notes (Signed)
Patient here for MH RV at 36 1/7. 36 week labs and packet (given) today. Patient prefers provider to collect labs today.Burt Knack, RN

## 2019-07-05 NOTE — Progress Notes (Signed)
PRENATAL VISIT NOTE  Subjective:  [name] Black is a 19 y.o. G1P0 at [redacted]w[redacted]d being seen today for ongoing prenatal care.  She is currently monitored for the following issues for this high-risk pregnancy and has Intentional drug overdose [facility]) age 56 on mom's rx--inpt [facility] hospital x 5 days; MDD (major depressive disorder), single episode, severe , no psychosis ([place]); PTSD (post-traumatic stress disorder); Depression affecting pregnancy, antepartum; Supervision of normal first pregnancy, antepartum; Nausea and vomiting during pregnancy; UTI (urinary tract infection) during pregnancy 01/24/19 E. 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  Patient reports body itching. Has had this x1 wk, worse at night. Not worse on extremities, palms or feet. Has used new lotion, started a few months ago. Has also started a new soap. Also uses Vicks VapoRub since itching started, not sure it helps. Takes hot showers. Denies rash. She has eaten cinnamon tea, bread, beans and meat this morning.    Contractions: Not present. Vag. Bleeding: None.  Movement: Present. Denies leaking of fluid/ROM.   The following portions of the patient's history were reviewed and updated as appropriate: allergies, current medications, past family history, past medical history, past social history, past surgical history and problem list. Problem list updated.  Objective:   Vitals:   07/05/19 1333  BP: 113/74  Pulse: (!) 107  Temp: 98.2 F (36.8 C)  Weight: 163 lb 9.6 oz (74.2 kg)    Fetal Status: Fetal Heart Rate (bpm): 145 Fundal Height: 34 cm Movement: Present  Presentation: Vertex  General:  Alert, oriented and cooperative. Patient is in no acute distress.  Skin: Skin is warm and dry. No rashes.  No signs of excoriation. No jaundice or scleral icterus.  Cardiovascular: Normal heart rate noted  Respiratory: Normal respiratory effort, no problems with respiration noted  Abdomen: Soft, gravid, appropriate for gestational age.  Pain/Pressure: Absent     Pelvic: Cervical exam deferred        Extremities: Normal range of motion.  Edema: None   Mental Status: Normal mood and affect. Normal behavior. Normal judgment and thought content.   Assessment and Plan:  Pregnancy: G1P0 at [redacted]w[redacted]d   1. Supervision of normal first pregnancy, antepartum -36 wk labs today.  -Reviewed s/sx of labor, when to go to hospital  -[place] for S<D on 4/21 wnl - growth is 33%, AFI 14.4 (WNL). - Chlamydia/GC NAA, Confirmation - Culture, beta strep (group b only)  2. Itching -Will get fasting bile acids tomorrow morning and advised pt to stop scented soaps/lotions/Vicks, stop hot showers. Advised if condition intensely worsens before results back to go to ER.  -Pt reports good fetal mov't. I reiterated importance of kick counts.  - Bile acids, total; Future  3. Anemia in pregnancy, third trimester -Continues iron supplementation. Hgb 3 wks ago 11+  4. Depression affecting pregnancy, antepartum -PHQ-9 score 7 d/t tired and less interest in things. She attributes this to physical symptoms of pregnancy, declines counseling.  -Pt agrees to and signed consent for 2 wk pp mood check d/t hx of depression.     Preterm labor symptoms and general obstetric precautions including but not limited to vaginal bleeding, contractions, leaking of fluid and fetal movement were reviewed in detail with the patient. Please refer to After Visit Summary for other counseling recommendations.  Return in about  1 week (around 07/12/2019) for routine prenatal care. And in 1 day for fasting bile acids.   Future Appointments  Date Time Provider Department Center  07/12/2019  1:20 PM [facility] PROVIDER [facility] None    Ann Held, PA-C

## 2019-07-05 NOTE — Progress Notes (Signed)
BH consent signed today, copy to A. Mariana Kaufman and copy sent for scanning. Patient counseled about fasting for bile acid test tomorrow, states understanding.Burt Knack, RN

## 2019-07-06 ENCOUNTER — Inpatient Hospital Stay
Admission: EM | Admit: 2019-07-06 | Discharge: 2019-07-08 | DRG: 806 | Disposition: A | Discharge status: Home / Self Care | Payer: Medicaid Other | Attending: Obstetrics & Gynecology | Admitting: Obstetrics & Gynecology

## 2019-07-06 ENCOUNTER — Inpatient Hospital Stay: Payer: Medicaid Other | Admitting: Anesthesiology

## 2019-07-06 ENCOUNTER — Other Ambulatory Visit: Payer: Self-pay

## 2019-07-06 ENCOUNTER — Encounter: Payer: Self-pay | Admitting: Obstetrics and Gynecology

## 2019-07-06 ENCOUNTER — Encounter: Payer: Self-pay | Admitting: Family Medicine

## 2019-07-06 DIAGNOSIS — D62 Acute posthemorrhagic anemia: Secondary | ICD-10-CM | POA: Diagnosis not present

## 2019-07-06 DIAGNOSIS — O9081 Anemia of the puerperium: Secondary | ICD-10-CM | POA: Diagnosis not present

## 2019-07-06 DIAGNOSIS — Z227 Latent tuberculosis: Secondary | ICD-10-CM

## 2019-07-06 DIAGNOSIS — O9802 Tuberculosis complicating childbirth: Secondary | ICD-10-CM | POA: Diagnosis present

## 2019-07-06 DIAGNOSIS — O42013 Preterm premature rupture of membranes, onset of labor within 24 hours of rupture, third trimester: Secondary | ICD-10-CM | POA: Diagnosis not present

## 2019-07-06 DIAGNOSIS — O26893 Other specified pregnancy related conditions, third trimester: Secondary | ICD-10-CM | POA: Diagnosis present

## 2019-07-06 DIAGNOSIS — Z20822 Contact with and (suspected) exposure to covid-19: Secondary | ICD-10-CM | POA: Diagnosis present

## 2019-07-06 DIAGNOSIS — O42913 Preterm premature rupture of membranes, unspecified as to length of time between rupture and onset of labor, third trimester: Secondary | ICD-10-CM | POA: Diagnosis present

## 2019-07-06 DIAGNOSIS — Z3A36 36 weeks gestation of pregnancy: Secondary | ICD-10-CM

## 2019-07-06 LAB — CBC
HCT: 34.8 % — ABNORMAL LOW (ref 36.0–46.0)
HCT: 35.9 % — ABNORMAL LOW (ref 36.0–46.0)
Hemoglobin: 11.8 g/dL — ABNORMAL LOW (ref 12.0–15.0)
Hemoglobin: 12 g/dL (ref 12.0–15.0)
MCH: 29 pg (ref 26.0–34.0)
MCH: 28.4 pg (ref 26.0–34.0)
MCHC: 33.9 g/dL (ref 30.0–36.0)
MCHC: 33.4 g/dL (ref 30.0–36.0)
MCV: 85.5 fL (ref 80.0–100.0)
MCV: 84.9 fL (ref 80.0–100.0)
Platelets: 270 10*3/uL (ref 150–400)
Platelets: 274 10*3/uL (ref 150–400)
RBC: 4.07 MIL/uL (ref 3.87–5.11)
RBC: 4.23 MIL/uL (ref 3.87–5.11)
RDW: 12.5 % (ref 11.5–15.5)
RDW: 12.6 % (ref 11.5–15.5)
WBC: 13.9 10*3/uL — ABNORMAL HIGH (ref 4.0–10.5)
WBC: 13 10*3/uL — ABNORMAL HIGH (ref 4.0–10.5)
nRBC: 0 % (ref 0.0–0.2)
nRBC: 0 % (ref 0.0–0.2)

## 2019-07-06 LAB — RUPTURE OF MEMBRANE (ROM)PLUS: Rom Plus: POSITIVE

## 2019-07-06 LAB — RESPIRATORY PANEL BY RT PCR (FLU A&B, COVID)
Influenza A by PCR: NEGATIVE
Influenza B by PCR: NEGATIVE
SARS Coronavirus 2 by RT PCR: NEGATIVE

## 2019-07-06 LAB — TYPE AND SCREEN
ABO/RH(D): O POS
Antibody Screen: NEGATIVE

## 2019-07-06 LAB — GROUP B STREP BY PCR: Group B strep by PCR: NEGATIVE

## 2019-07-06 LAB — ABO/RH: ABO/RH(D): O POS

## 2019-07-06 LAB — CHLAMYDIA/NGC RT PCR (ARMC ONLY)
Chlamydia Tr: NOT DETECTED
N gonorrhoeae: NOT DETECTED

## 2019-07-06 LAB — RPR: RPR Ser Ql: NONREACTIVE

## 2019-07-06 MED ORDER — OXYTOCIN 40 UNITS IN NORMAL SALINE INFUSION - SIMPLE MED
INTRAVENOUS | Status: AC
  Administered 2019-07-06: 12:00:00 2 m[IU]/min via INTRAVENOUS
  Filled 2019-07-06: qty 1000

## 2019-07-06 MED ORDER — COCONUT OIL OIL
1.0000 "application " | TOPICAL_OIL | Status: DC | PRN
  Filled 2019-07-06: qty 120

## 2019-07-06 MED ORDER — SODIUM CHLORIDE 0.9 % IV SOLN
INTRAVENOUS | Status: DC | PRN
  Administered 2019-07-06 (×3): 5 mL via EPIDURAL

## 2019-07-06 MED ORDER — MISOPROSTOL 200 MCG PO TABS
ORAL_TABLET | ORAL | Status: AC
  Filled 2019-07-06: qty 4

## 2019-07-06 MED ORDER — FENTANYL 2.5 MCG/ML W/ROPIVACAINE 0.15% IN NS 100 ML EPIDURAL (ARMC)
EPIDURAL | Status: AC
  Filled 2019-07-06: qty 100

## 2019-07-06 MED ORDER — LACTATED RINGERS IV SOLN: 500.0000 mL | INTRAVENOUS | Status: DC | PRN

## 2019-07-06 MED ORDER — TERBUTALINE SULFATE 1 MG/ML IJ SOLN
0.2500 mg | Freq: Once | INTRAMUSCULAR | Status: AC | PRN
  Administered 2019-07-06: 0.25 mg via SUBCUTANEOUS
  Filled 2019-07-06: qty 1

## 2019-07-06 MED ORDER — DIBUCAINE (PERIANAL) 1 % EX OINT
1.0000 "application " | TOPICAL_OINTMENT | CUTANEOUS | Status: DC | PRN
  Filled 2019-07-06 (×2): qty 28

## 2019-07-06 MED ORDER — ONDANSETRON HCL 4 MG/2ML IJ SOLN: 4.0000 mg | Freq: Four times a day (QID) | INTRAMUSCULAR | Status: DC | PRN

## 2019-07-06 MED ORDER — TERBUTALINE SULFATE 1 MG/ML IJ SOLN: 0.2500 mg | Freq: Once | INTRAMUSCULAR | Status: DC | PRN

## 2019-07-06 MED ORDER — ACETAMINOPHEN 325 MG PO TABS
650.0000 mg | ORAL_TABLET | ORAL | Status: DC | PRN
  Administered 2019-07-06 – 2019-07-08 (×4): 650 mg via ORAL
  Filled 2019-07-06 (×4): qty 2

## 2019-07-06 MED ORDER — ONDANSETRON HCL 4 MG/2ML IJ SOLN: 4.0000 mg | INTRAMUSCULAR | Status: DC | PRN

## 2019-07-06 MED ORDER — LACTATED RINGERS IV SOLN: INTRAVENOUS | Status: DC

## 2019-07-06 MED ORDER — BUTORPHANOL TARTRATE 1 MG/ML IJ SOLN
1.0000 mg | Freq: Once | INTRAMUSCULAR | Status: AC
  Administered 2019-07-06: 1 mg via INTRAVENOUS
  Filled 2019-07-06: qty 1

## 2019-07-06 MED ORDER — AMMONIA AROMATIC IN INHA
RESPIRATORY_TRACT | Status: AC
  Filled 2019-07-06: qty 10

## 2019-07-06 MED ORDER — SENNOSIDES-DOCUSATE SODIUM 8.6-50 MG PO TABS
2.0000 | ORAL_TABLET | ORAL | Status: DC
  Administered 2019-07-07 – 2019-07-08 (×2): 2 via ORAL
  Filled 2019-07-06 (×2): qty 2

## 2019-07-06 MED ORDER — LIDOCAINE HCL (PF) 1 % IJ SOLN: 30.0000 mL | INTRAMUSCULAR | Status: DC | PRN

## 2019-07-06 MED ORDER — OXYCODONE HCL 5 MG PO TABS: 5.0000 mg | ORAL_TABLET | ORAL | Status: DC | PRN

## 2019-07-06 MED ORDER — BUTORPHANOL TARTRATE 1 MG/ML IJ SOLN
1.0000 mg | Freq: Once | INTRAMUSCULAR | Status: AC
  Administered 2019-07-06: 02:00:00 1 mg via INTRAVENOUS
  Filled 2019-07-06: qty 1

## 2019-07-06 MED ORDER — BENZOCAINE-MENTHOL 20-0.5 % EX AERO
1.0000 "application " | INHALATION_SPRAY | CUTANEOUS | Status: DC | PRN
  Filled 2019-07-06 (×2): qty 56

## 2019-07-06 MED ORDER — SIMETHICONE 80 MG PO CHEW: 80.0000 mg | CHEWABLE_TABLET | ORAL | Status: DC | PRN

## 2019-07-06 MED ORDER — SOD CITRATE-CITRIC ACID 500-334 MG/5ML PO SOLN: 30.0000 mL | ORAL | Status: DC | PRN

## 2019-07-06 MED ORDER — OXYTOCIN 10 UNIT/ML IJ SOLN
INTRAMUSCULAR | Status: AC
  Filled 2019-07-06: qty 2

## 2019-07-06 MED ORDER — PRENATAL MULTIVITAMIN CH
1.0000 | ORAL_TABLET | Freq: Every day | ORAL | Status: DC
  Administered 2019-07-07: 12:00:00 1 via ORAL
  Filled 2019-07-06: qty 1

## 2019-07-06 MED ORDER — EPHEDRINE 5 MG/ML INJ: 10.0000 mg | INTRAVENOUS | Status: DC | PRN

## 2019-07-06 MED ORDER — LIDOCAINE HCL (PF) 1 % IJ SOLN
INTRAMUSCULAR | Status: AC
  Administered 2019-07-06: 15:00:00 30 mL
  Filled 2019-07-06: qty 30

## 2019-07-06 MED ORDER — DIPHENHYDRAMINE HCL 50 MG/ML IJ SOLN: 12.5000 mg | INTRAMUSCULAR | Status: DC | PRN

## 2019-07-06 MED ORDER — OXYTOCIN 40 UNITS IN NORMAL SALINE INFUSION - SIMPLE MED: 1.0000 m[IU]/min | INTRAVENOUS | Status: DC

## 2019-07-06 MED ORDER — FENTANYL 2.5 MCG/ML W/ROPIVACAINE 0.15% IN NS 100 ML EPIDURAL (ARMC)
12.0000 mL/h | EPIDURAL | Status: DC
  Administered 2019-07-06: 12 mL/h via EPIDURAL

## 2019-07-06 MED ORDER — IBUPROFEN 600 MG PO TABS
600.0000 mg | ORAL_TABLET | Freq: Four times a day (QID) | ORAL | Status: DC
  Administered 2019-07-06 – 2019-07-08 (×6): 600 mg via ORAL
  Filled 2019-07-06 (×8): qty 1

## 2019-07-06 MED ORDER — FERROUS SULFATE 325 (65 FE) MG PO TABS
325.0000 mg | ORAL_TABLET | Freq: Every day | ORAL | Status: DC
  Administered 2019-07-07 – 2019-07-08 (×2): 325 mg via ORAL
  Filled 2019-07-06 (×2): qty 1

## 2019-07-06 MED ORDER — ONDANSETRON HCL 4 MG PO TABS: 4.0000 mg | ORAL_TABLET | ORAL | Status: DC | PRN

## 2019-07-06 MED ORDER — LIDOCAINE-EPINEPHRINE (PF) 1.5 %-1:200000 IJ SOLN
INTRAMUSCULAR | Status: DC | PRN
  Administered 2019-07-06: 3 mL

## 2019-07-06 MED ORDER — ACETAMINOPHEN 325 MG PO TABS: 650.0000 mg | ORAL_TABLET | ORAL | Status: DC | PRN

## 2019-07-06 MED ORDER — LACTATED RINGERS IV SOLN
500.0000 mL | Freq: Once | INTRAVENOUS | Status: AC
  Administered 2019-07-06: 500 mL via INTRAVENOUS

## 2019-07-06 MED ORDER — PENICILLIN G 3 MILLION UNITS IVPB - SIMPLE MED
3.0000 10*6.[IU] | INTRAVENOUS | Status: DC
  Administered 2019-07-06: 12:00:00 3 10*6.[IU] via INTRAVENOUS
  Filled 2019-07-06: qty 100

## 2019-07-06 MED ORDER — OXYTOCIN BOLUS FROM INFUSION
500.0000 mL | Freq: Once | INTRAVENOUS | Status: AC
  Administered 2019-07-06: 500 mL via INTRAVENOUS

## 2019-07-06 MED ORDER — PHENYLEPHRINE 40 MCG/ML (10ML) SYRINGE FOR IV PUSH (FOR BLOOD PRESSURE SUPPORT): 80.0000 ug | PREFILLED_SYRINGE | INTRAVENOUS | Status: DC | PRN

## 2019-07-06 MED ORDER — OXYTOCIN 40 UNITS IN NORMAL SALINE INFUSION - SIMPLE MED
2.5000 [IU]/h | INTRAVENOUS | Status: DC
  Administered 2019-07-06: 2.5 [IU]/h via INTRAVENOUS

## 2019-07-06 MED ORDER — BUTORPHANOL TARTRATE 1 MG/ML IJ SOLN
1.0000 mg | INTRAMUSCULAR | Status: DC | PRN
  Administered 2019-07-06: 1 mg via INTRAVENOUS
  Filled 2019-07-06: qty 1

## 2019-07-06 MED ORDER — SODIUM CHLORIDE 0.9 % IV SOLN
5.0000 10*6.[IU] | Freq: Once | INTRAVENOUS | Status: AC
  Administered 2019-07-06: 08:00:00 5 10*6.[IU] via INTRAVENOUS
  Filled 2019-07-06: qty 5

## 2019-07-06 MED ORDER — WITCH HAZEL-GLYCERIN EX PADS
1.0000 "application " | MEDICATED_PAD | CUTANEOUS | Status: DC | PRN
  Filled 2019-07-06 (×2): qty 100

## 2019-07-06 NOTE — H&P (Signed)
Obstetric H&P   Chief Complaint: Contractions  Prenatal Care Provider: [facility]  History of Present Illness: 19 y.o. G1P0 71w2dby 08/01/2019, by Ultrasound presenting to L&D with contractions.  Contractions have increased over night and slow change in cervix to 4cm from 2cm on presentation.  She has had some vaginal spotting.  Reported clear LOF this morning with ROM plus sent and positive.  Pregnancy has been uncomplicated to date.  +FM.  Pregravid weight 67.1 kg Total Weight Gain 7.076 kg  Pregnant Problems (from 01/21/19 to present)    Problem Noted Resolved   Anemia in pregnancy, third trimester 05/11/2019 by SDebera Lat RN No   Overview Signed 05/11/2019  3:35 PM by SDebera Lat RN    FeSo4 325 mg po a day with juice started 05/11/19; anemia panel drawn & counseled on Fe-rich foods      Depression affecting pregnancy, antepartum 01/24/2019 by NCaren Macadam MD No   Overview Addendum 04/20/2019  2:07 PM by SHerbie Saxon CNM    [x ] offer LCSW at next visit in Dec 2020--declines PHQ-9 on 04/20/19=2 _0  2 wk pp mood check      Supervision of normal first pregnancy, antepartum 01/24/2019 by NCaren Macadam MD No   Overview Addendum 06/13/2019  9:44 AM by NCaren Macadam MD     Nursing Staff Provider  Office Location  [facility] Dating  14 wk [place] Language  English or Spanish Anatomy [place] Done 03/09/19; ant no previa, wnl  Flu Vaccine  01/24/2019 Genetic Screen    Quad: Desires- draw at 15-23wk 02/21/19 Quad screen=neg  TDaP vaccine   05/11/2019 Hgb A1C or  GTT  Third trimester 95  Rhogam   NA   LAB RESULTS   Feeding Plan  Both Blood Type O/Positive/-- (11/16 1540)   Contraception  Unsure Antibody Negative (11/16 1540)  Circumcision  na- girl Rubella  MMR x 2 (2008)  [place]PR Non Reactive (03/03 1520)   Support Person  HBsAg Negative (11/16 1540)   Prenatal Classes  HIV Non Reactive (03/03 0000)  _1 - Doula referral?   Varicella  Varivax x 2 (2011)  BTL Consent NA GBS  (For PCN allergy, check sensitivities)        VBAC Consent NA Pap  _2     Hgb Electro    BP Cuff ordered  CF   Delivery Group  [facility]   Centering Group                 Review of Systems: 10 point review of systems negative unless otherwise noted in HPI  Past Medical History: Patient Active Problem List   Diagnosis Date Noted  . Labor and delivery indication for care or intervention 07/06/2019  . Preterm labor 07/06/2019  . Uterine size-date discrepancy in third trimester 06/27/2019    _3  [place]/21/21- Normal EFW 33% and AC 38%.  AFI wnl    . Anemia in pregnancy, third trimester 05/11/2019    FeSo4 325 mg po a day with juice started 05/11/19; anemia panel drawn & counseled on Fe-rich foods   . 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 UTI (urinary tract infection) during pregnancy 01/24/19 E. Coli 10-25,000 01/26/2019    [x ] Needs Macrobid 100 mg po BID x 7 days--given on 01/28/19 [x ] TOC after tx completed=02/21/19= no growth   . Depression  affecting pregnancy, antepartum 01/24/2019    [x ] offer LCSW at next visit in Dec 2020--declines PHQ-9 on 04/20/19=2 _0  2 wk pp mood check   . Supervision of normal first pregnancy, antepartum 01/24/2019     Nursing Staff Provider  Office Location  [facility] Dating  63 wk [place]  Language  English or Spanish Anatomy [place]  Done 03/09/19; ant no previa, wnl  Flu Vaccine  01/24/2019 Genetic Screen    Quad: Desires- draw at 15-23wk 02/21/19 Quad screen=neg  TDaP vaccine   05/11/2019 Hgb A1C or  GTT Early  Third trimester   Rhogam   NA   LAB RESULTS   Feeding Plan  Both Blood Type O/Positive/-- (11/16 1540)   Contraception  Unsure Antibody Negative (11/16 1540)  Circumcision  na- girl Rubella  MMR x 2 (2008)  [place] RPR Non Reactive (03/03 1520)   Support Person  HBsAg Negative (11/16 1540)     Prenatal Classes  HIV Non Reactive (03/03 0000)  _1 - Doula referral?  Varicella  Varivax x 2 (2011)  BTL Consent NA GBS  (For PCN allergy, check sensitivities)        VBAC Consent NA Pap  _2     Hgb Electro    BP Cuff ordered  CF   Delivery Group  [facility] S[facility]   Centering Group         . Nausea and vomiting during pregnancy 01/24/2019  . MDD (major depressive disorder), single episode, severe , no psychosis ([place]) 03/20/2014  . PTSD (post-traumatic stress disorder) 03/20/2014  . Intentional drug overdose [facility]) age 34 on mom's rx--inpt [facility] x 5 days 03/16/2014    Transferred to in pt facility x 2 wks with therapy Declines need for [place]     Past Surgical History: History reviewed. No pertinent surgical history.  Past Obstetric History: # 1 - Date: None, Sex: None, Weight: None, GA: None, Delivery: None, Apgar1: None, Apgar5: None, Living: None, Birth Comments: None   Past Gynecologic History:  Family History: Family History  Problem Relation Age of Onset  . Cancer Maternal Grandmother   . Multiple births Maternal Aunt     Social History: Social History   Socioeconomic History  . Marital status: Single    Spouse name: Not on file  . Number of children: Not on file  . Years of education: 35  . Highest education level: Not on file  Occupational History  . Occupation: Ship broker  Tobacco Use  . Smoking status: Never Smoker  . Smokeless tobacco: Never Used  Substance and Sexual Activity  . Alcohol use: Not Currently    Comment: Last ETOH-09/2018  . Drug use: No  . Sexual activity: Yes    Birth control/protection: Injection    Comment: Hx Depo per record-last use years ago  Other Topics Concern  . Not on file  Social History Narrative   Lives with FOB and family    Social Determinants of Health   Financial Resource Strain:   . Difficulty of Paying Living Expenses:   Food Insecurity:   . Worried About Charity fundraiser in the Last Year:    . Arboriculturist in the Last Year:   Transportation Needs: No Transportation Needs  . Lack of Transportation (Medical): No  . Lack of Transportation (Non-Medical): No  Physical Activity:   . Days of Exercise per Week:   . Minutes of Exercise per Session:   Stress:   .  Feeling of Stress :   Social Connections:   . Frequency of Communication with Friends and Family:   . Frequency of Social Gatherings with Friends and Family:   . Attends Religious Services:   . Active Member of Clubs or Organizations:   . Attends Archivist Meetings:   Marland Kitchen Marital Status:   Intimate Partner Violence: Not At Risk  . Fear of Current or Ex-Partner: No  . Emotionally Abused: No  . Physically Abused: No  . Sexually Abused: No    Medications: Prior to Admission medications   Medication Sig Start Date End Date Taking? Authorizing Provider  ferrous sulfate 325 (65 FE) MG tablet Take 1 tablet (325 mg total) by mouth daily with breakfast. 05/11/19 08/19/19 Yes Sciora, Real Cons, CNM  Prenatal Vit-Fe Fumarate-FA (PRENATAL VITAMIN) 27-0.8 MG TABS Take 1 tablet by mouth daily. 04/20/19  Yes Caren Macadam, MD  promethazine (PHENERGAN) 25 MG tablet Take 1 tablet (25 mg total) by mouth every 6 (six) hours as needed for nausea or vomiting. Patient not taking: Reported on 01/28/2019 01/24/19   Caren Macadam, MD    Allergies: No Known Allergies  Physical Exam: Vitals: Blood pressure 129/87, pulse 88, temperature 98.1 F (36.7 C), temperature source Oral, resp. rate 18, height 5' (1.524 m), weight 73.9 kg, last menstrual period 11/05/2018, SpO2 99 %.  Urine Dip Protein: N/A  FHT: 140, moderate, +accels, no decels Toco: q79mn  General: NAD HEENT: normocephalic, anicteric Pulmonary: No increased work of breathing Cardiovascular: RRR, distal pulses 2+ Abdomen: Gravid, non-tender Genitourinary: Dilation: 4 Effacement (%): 80, 90 Cervical Position: Middle Station:  -1 Presentation: Vertex Exam by:: R. spielman  Extremities: no edema, erythema, or tenderness Neurologic: Grossly intact Psychiatric: mood appropriate, affect full  Labs: Results for orders placed or performed during the hospital encounter of 07/06/19 (from the past 24 hour(s))  CBC     Status: Abnormal   Collection Time: 07/06/19  2:32 AM  Result Value Ref Range   WBC 13.9 (H) 4.0 - 10.5 K/uL   RBC 4.07 3.87 - 5.11 MIL/uL   Hemoglobin 11.8 (L) 12.0 - 15.0 g/dL   HCT 34.8 (L) 36.0 - 46.0 %   MCV 85.5 80.0 - 100.0 fL   MCH 29.0 26.0 - 34.0 pg   MCHC 33.9 30.0 - 36.0 g/dL   RDW 12.5 11.5 - 15.5 %   Platelets 270 150 - 400 K/uL   nRBC 0.0 0.0 - 0.2 %  Type and screen [place]    Status: None   Collection Time: 07/06/19  2:32 AM  Result Value Ref Range   ABO/RH(D) O POS    Antibody Screen NEG    Sample Expiration      07/09/2019,2359 [facility] [address] [place] [address]  Group B strep by PCR     Status: None   Collection Time: 07/06/19  2:32 AM   Specimen: Vaginal/Rectal; Genital  Result Value Ref Range   Group B strep by PCR NEGATIVE NEGATIVE  Chlamydia/NGC rt PCR ([place])     Status: None   Collection Time: 07/06/19  2:32 AM   Specimen: Vaginal/Rectal  Result Value Ref Range   Specimen source GC/Chlam URINE, RANDOM    Chlamydia Tr NOT DETECTED NOT DETECTED   N gonorrhoeae NOT DETECTED NOT DETECTED  Respiratory Panel by RT PCR (Flu A&B, Covid) - Vaginal/Rectal     Status: None   Collection Time: 07/06/19  2:33 AM   Specimen: Vaginal/Rectal  Result Value Ref Range   SARS Coronavirus 2 by RT PCR NEGATIVE NEGATIVE   Influenza A by PCR NEGATIVE NEGATIVE   Influenza B by PCR NEGATIVE NEGATIVE  ROM Plus ([facility] only)     Status: None   Collection Time: 07/06/19  6:32 AM  Result Value Ref Range   Rom Plus POSITIVE   ABO/Rh     Status: None (Preliminary result)   Collection Time: 07/06/19  6:33 AM   Result Value Ref Range   ABO/RH(D) PENDING    US OB Follow Up  Result Date: 06/29/2019 Patient Name: Samika Vetsch DOB: 2000/09/08 MRN: [id number] ULTRASOUND REPORT Location: [facility] Date of Service: 06/29/2019 Indications:growth/afi Findings: Nelda Marseille intrauterine pregnancy is visualized with FHR at 141 BPM. Biometrics give an (U/S) Gestational age of 10w4dand an (U/S) EDD of 08/06/2019; this correlates with the clinically established Estimated Date of Delivery: 08/01/19. Fetal presentation is Cephalic. Placenta: anterior. Grade: 1 AFI: 14.4 cm Growth percentile is 33.3%.  AC percentile is 38.7%. EFW: 2473 g  ( 5 lb 7 oz ) Impression: 1. 350w2diable Singleton Intrauterine pregnancy previously established criteria. 2. Growth is 33.3 %ile.  AFI is 14.4 cm. Recommendations: 1.Clinical correlation with the patient's History and Physical Exam. ElGweneth DimitriRT There is a singleton gestation with normal amniotic fluid volume. The fetal biometry correlates with established dating.  The visualized fetal anatomy appears within normal limits within the resolution of ultrasound as described above.  It must be noted that a normal ultrasound is unable to rule out fetal aneuploidy.  AnMalachy MoodMD, FAMatthewsB/GYN, CoDripping Springsroup 06/29/2019, 4:55 PM    Assessment: 1987.o. G1P0 3640w2d 08/01/2019, by 14 week Ultrasound presenting in preterm labor and now with SROM  Plan: 1) Labor - expectant management may have epidural - ROM plus positive  2) Fetus - cat I tracing  3) PNL - Blood type --/--/PENDING (04/28 0634782 Anti-bodyscreen NEG (04/28 0232) / Rubella   / Varicella Immune by vaccination history / RPR Non Reactive (03/03 1520) / HBsAg Negative (11/16 1540) / HIV Non Reactive (03/03 0000) / 1-hr OGTT 95 / GBS NEGATIVE/-- (04/28 0232)  - rapid GBS negative but in the setting of expected delivery prior to 37 weeks with rupture of membranes GBS prophylaxis is indicated in  the setting of unknown GBS status.  ("Prevention of Early Onset Group-B Streptococcal Disease in New Borns"  ACOG Committee Opinion 797 February 2020 and replacing 2010 CDC  "Guidlines for the Prevention of Perinatal Group B Streptococcal Disease")   4) Immunization History -  Immunization History  Administered Date(s) Administered  . BCG 03/19/2000  . DTaP 05/11/2000, 07/13/2000, 09/15/2000, 08/02/2002, 03/15/2004  . HPV Quadrivalent 07/08/2012, 09/07/2012, 01/10/2013  . Hepatitis A 09/29/2007, 10/12/2009  . Hepatitis B 05/11/2000, 07/13/2000, 09/15/2000  . HiB (PRP-OMP) 05/11/2000, 07/13/2000, 09/15/2000  . IPV 05/11/2000, 07/13/2000, 08/01/2000, 09/15/2000, 04/30/2001, 07/25/2003, 07/31/2004  . Influenza,inj,Quad PF,6+ Mos 02/22/2015, 02/19/2016, 12/16/2017, 01/24/2019  . Influenza-Unspecified 12/21/2010, 01/23/2011, 01/10/2013  . MMR 03/19/2001, 04/21/2006  . Measles 07/31/2004  . Meningococcal Conjugate 07/08/2012  . Rubella 07/31/2004  . Td 07/08/2012  . Tdap 07/08/2012, 05/11/2019  . Varicella 09/29/2007, 10/12/2009    5) Disposition - pending delivery  AndMalachy MoodD, FACBlountstownonLos Alvarezoup 07/06/2019, 7:49 AM

## 2019-07-06 NOTE — Progress Notes (Signed)
Pt moved to LDR 5 for Labor with SROM. Epidural requested.

## 2019-07-06 NOTE — Progress Notes (Signed)
L&D Note  07/06/2019 - 12:51 PM  19 y.o. G1P0 [redacted]w[redacted]d (14 week [place]). Pregnancy complicated by LTBI, late entry to care, and history of depression.   Patient Active Problem List   Diagnosis Date Noted  . Labor and delivery indication for care or intervention 07/06/2019  . Preterm labor 07/06/2019  . Uterine size-date discrepancy in third trimester 06/27/2019  . Anemia in pregnancy, third trimester 05/11/2019  . 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 UTI (urinary tract infection) during pregnancy 01/24/19 E. Coli 10-25,000 01/26/2019  . Depression affecting pregnancy, antepartum 01/24/2019  . Supervision of normal first pregnancy, antepartum 01/24/2019  . Nausea and vomiting during pregnancy 01/24/2019  . MDD (major depressive disorder), single episode, severe , no psychosis ([facility]) 03/20/2014  . PTSD (post-traumatic stress disorder) 03/20/2014  . Intentional drug overdose [facility]) age 55 on mom's rx--inpt [facility] hospital x 5 days 03/16/2014    Ms. Marlou Sa Rufino was admitted for PPROM at 0615 this AM/ early labor   Subjective:  Has been able to sleep after the epidural Objective:   Vitals:   07/06/19 0921 07/06/19 1121 07/06/19 1126 07/06/19 1156  BP: 117/66 110/66    Pulse: 88 70    Resp:      Temp:    98.1 F (36.7 C)  TempSrc:    Oral  SpO2: 99% 97% 97%   Weight:      Height:        Current Vital Signs 24h Vital Sign Ranges  T 98.1 F (36.7 C) Temp  Avg: 98.1 F (36.7 C)  Min: 98.1 F (36.7 C)  Max: 98.2 F (36.8 C)  BP 110/66 BP  Min: 110/66  Max: 140/97  HR 70 Pulse  Avg: 89.9  Min: 70  Max: 108  RR 18 Resp  Avg: 18  Min: 18  Max: 18  SaO2 97 %   SpO2  Avg: 97.9 %  Min: 97 %  Max: 99 %       24 Hour I/O Current Shift I/O  Time Ins Outs No intake/output data recorded. No intake/output data recorded.   FHR: 130 baseline with accelerations to 140s to 150, moderate and sometimes minimal variability. Had a 30 sec deceleration  to 60s when forebag was AROMd while supine Toco: contractions every 2-6 minutes apart Gen: comfortable with epidural. SVE: 4/80%/-1. AROM forebag for small amount MSAF  Labs:  Recent Labs  Lab 07/06/19 0232 07/06/19 0808  WBC 13.9* 13.0*  HGB 11.8* 12.0  HCT 34.8* 35.9*  PLT 270 274     Medications Current Facility-Administered Medications  Medication Dose Route Frequency Provider Last Rate Last Admin  . acetaminophen (TYLENOL) tablet 650 mg  650 mg Oral Q4H PRN Vena Austria, MD      . ammonia inhalant           . diphenhydrAMINE (BENADRYL) injection 12.5 mg  12.5 mg Intravenous Q15 min PRN Piscitello, Cleda Mccreedy, MD      . ePHEDrine injection 10 mg  10 mg Intravenous PRN Piscitello, Cleda Mccreedy, MD      . ePHEDrine injection 10 mg  10 mg Intravenous PRN Piscitello, Cleda Mccreedy, MD      . fentaNYL 2.5 mcg/ml w/ropivacaine 0.15% (PF) in normal saline 100 mL EPIDURAL infusion  12 mL/hr Epidural Continuous Piscitello, Cleda Mccreedy, MD 12 mL/hr at 07/06/19 0834 12 mL/hr at 07/06/19 0834  . lactated ringers infusion 500 mL  500 mL Intravenous Once Piscitello, Cleda Mccreedy, MD      .  lactated ringers infusion 500-1,000 mL  500-1,000 mL Intravenous PRN Malachy Mood, MD      . lactated ringers infusion   Intravenous Continuous Malachy Mood, MD 125 mL/hr at 07/06/19 0845 New Bag at 07/06/19 0845  . lidocaine (PF) (XYLOCAINE) 1 % injection 30 mL  30 mL Subcutaneous PRN Malachy Mood, MD      . lidocaine (PF) (XYLOCAINE) 1 % injection           . misoprostol (CYTOTEC) 200 MCG tablet           . ondansetron (ZOFRAN) injection 4 mg  4 mg Intravenous Q6H PRN Malachy Mood, MD      . oxytocin (PITOCIN) 10 UNIT/ML injection           . oxytocin (PITOCIN) IV BOLUS FROM BAG  500 mL Intravenous Once Malachy Mood, MD      . oxytocin (PITOCIN) IV infusion 40 units in NS 1000 mL - Premix  2.5 Units/hr Intravenous Continuous Malachy Mood, MD      . oxytocin (PITOCIN) IV infusion 40  units in NS 1000 mL - Premix  1-40 milli-units/min Intravenous Titrated Dalia Heading, CNM 3 mL/hr at 07/06/19 1206 2 milli-units/min at 07/06/19 1206  . penicillin G 3 million units in sodium chloride 0.9% 100 mL IVPB  3 Million Units Intravenous Q4H Malachy Mood, MD 200 mL/hr at 07/06/19 1156 3 Million Units at 07/06/19 1156  . PHENYLephrine 40 mcg/ml in normal saline Adult IV Push Syringe (For Blood Pressure Support)  80 mcg Intravenous PRN Piscitello, Precious Haws, MD      . PHENYLephrine 40 mcg/ml in normal saline Adult IV Push Syringe (For Blood Pressure Support)  80 mcg Intravenous PRN Piscitello, Precious Haws, MD      . sodium citrate-citric acid (ORACIT) solution 30 mL  30 mL Oral Q2H PRN Malachy Mood, MD      . terbutaline (BRETHINE) injection 0.25 mg  0.25 mg Subcutaneous Once PRN Dalia Heading, CNM       Facility-Administered Medications Ordered in Other Encounters  Medication Dose Route Frequency Provider Last Rate Last Admin  . bupivacaine (PF) 0.125% dilution in normal saline for epidural injection   Epidural Continuous PRN Penwarden, Amy, MD   5 mL at 07/06/19 0834  . lidocaine-EPINEPHrine 1.5 %-1:200000 injection   Infiltration Anesthesia Intra-op Penwarden, Amy, MD   3 mL at 07/06/19 0824    Assessment & Plan:   IUP at 36wk2d with PPROM and meconium stained amniotic fluid  No cervical progress since admission  Start Pitocin augmentation  Closely monitor fetal well being-overall reassuring FH tracing GBS: GBS PCR was negative, but per ACOG guidelines, continues on PCN PPX. Receiving second dose now Good relief with epidural  Dalia Heading, CNM

## 2019-07-06 NOTE — Anesthesia Preprocedure Evaluation (Signed)
Anesthesia Evaluation  Patient identified by MRN, date of birth, ID band Patient awake    Reviewed: Allergy & Precautions, NPO status , Patient's Chart, lab work & pertinent test results  History of Anesthesia Complications Negative for: history of anesthetic complications  Airway Mallampati: II  TM Distance: >3 FB Neck ROM: Full    Dental no notable dental hx.    Pulmonary neg pulmonary ROS, neg sleep apnea, neg COPD,    breath sounds clear to auscultation- rhonchi (-) wheezing      Cardiovascular Exercise Tolerance: Good (-) hypertension(-) CAD, (-) Past MI, (-) Cardiac Stents and (-) CABG  Rhythm:Regular Rate:Normal - Systolic murmurs and - Diastolic murmurs    Neuro/Psych neg Seizures PSYCHIATRIC DISORDERS Anxiety Depression negative neurological ROS     GI/Hepatic negative GI ROS, Neg liver ROS,   Endo/Other  negative endocrine ROSneg diabetes  Renal/GU negative Renal ROS     Musculoskeletal   Abdominal (+) + obese,   Peds  Hematology  (+) anemia ,   Anesthesia Other Findings Past Medical History: 05/11/2019: Anemia in pregnancy, third trimester No date: Depression     Comment:  Per record-2015 Drug Overdose No date: Hypertension     Comment:  Per record-Hx malignant HTN 07/06/2019: Preterm labor No date: PTSD (post-traumatic stress disorder) 01/26/2019: UTI (urinary tract infection) during pregnancy 01/24/19 E.  Coli 10-25,000   Reproductive/Obstetrics (+) Pregnancy                             Lab Results  Component Value Date   WBC 13.0 (H) 07/06/2019   HGB 12.0 07/06/2019   HCT 35.9 (L) 07/06/2019   MCV 84.9 07/06/2019   PLT 274 07/06/2019    Anesthesia Physical Anesthesia Plan  ASA: II  Anesthesia Plan: Epidural   Post-op Pain Management:    Induction:   PONV Risk Score and Plan: 2 and Treatment may vary due to age or medical condition  Airway Management  Planned:   Additional Equipment:   Intra-op Plan:   Post-operative Plan:   Informed Consent: I have reviewed the patients History and Physical, chart, labs and discussed the procedure including the risks, benefits and alternatives for the proposed anesthesia with the patient or authorized representative who has indicated his/her understanding and acceptance.       Plan Discussed with: CRNA and Anesthesiologist  Anesthesia Plan Comments: (Plan for epidural for labor, discussed epidural vs spinal vs GA if need for csection)        Anesthesia Quick Evaluation

## 2019-07-06 NOTE — OB Triage Note (Signed)
Pt reports contractions that started at 1600 on 4/27. 3 minutes apart. No LOF, small bloody show. FHR 140 at time of triage.

## 2019-07-06 NOTE — Lactation Note (Signed)
This note was copied from a baby's chart. Lactation Consultation Note  Patient Name: Girl Amyre Segundo [id number] Date: 07/06/2019 Reason for consult: Initial assessment;1st time breastfeeding;Late-preterm 34-36.6wks  LC called in to LDR 5 by L&D RN for breastfeeding support. Attempts made to latch baby have been unsuccessful thus far. Baby born at [redacted]w[redacted]d to G1P1 mom.  Baby was in cradle hold on mom's left breast, mom using elbow to bring baby in and scissor hold to sandwich the breast tissue. Baby able to open mouth wide, but unable to sustain latch; mom reporting pinching/pain when baby was at the breast. LC adjusted baby's position to bring baby nose to nipple, adjusted hip placement so tummy was turned in, and held breast tissue in sandwich hold, baby able to grasp breast but after 2-3 sucks unable to sustain latch; when baby pulled back, possible heart shaped tongue noted. LC provided mom with 40mm nipple shield, discussed use and placement. Baby was able to grasp the shield and sustain latch, and fed well for 14 min observed by LC with strong rhythmic sucking pattern with a few efforts in keeping stimulated.  LC discussed with mom the possible need to use DEBP due to baby's gestation age, as well as use of nipple shield to ensure adequate stimulation and milk removal to aid in the onset of appropriate milk supply; mom agreeable. BF basics discussed: impact of nipple shield, newborn feeding behaviors, newborn stomach size, early hunger cues, output expectations, and placement/cleaning of nipple shield. Mom acknowledges the information. Baby still BF when LC left the room, mom reporting she felt good at this time, no questions/concerns.  Maternal Data Has patient been taught Hand Expression?: Yes Does the patient have breastfeeding experience prior to this delivery?: No  Feeding Feeding Type: Breast Fed  LATCH Score Latch: Repeated attempts needed to sustain latch, nipple held in mouth  throughout feeding, stimulation needed to elicit sucking reflex.  Audible Swallowing: None  Type of Nipple: Everted at rest and after stimulation  Comfort (Breast/Nipple): Soft / non-tender  Hold (Positioning): Assistance needed to correctly position infant at breast and maintain latch.  LATCH Score: 6  Interventions Interventions: Breast feeding basics reviewed;Assisted with latch;Hand express;Breast compression;Adjust position;Support pillows  Lactation Tools Discussed/Used Tools: Nipple Shields Nipple shield size: 16   Consult Status Consult Status: Follow-up Date: 07/06/19 Follow-up type: In-patient    Danford Bad 07/06/2019, 4:15 PM

## 2019-07-06 NOTE — Discharge Instructions (Signed)
 Lactancia materna Breastfeeding  Decidir amamantar es una de las mejores elecciones que puede hacer por usted y su beb. Un cambio en las hormonas durante el embarazo hace que las mamas produzcan leche materna en las glndulas productoras de leche. Las hormonas impiden que la leche materna sea liberada antes del nacimiento del beb. Adems, impulsan el flujo de leche luego del nacimiento. Una vez que ha comenzado a amamantar, pensar en el beb, as como la succin o el llanto, pueden estimular la liberacin de leche de las glndulas productoras de leche. Los beneficios de amamantar Las investigaciones demuestran que la lactancia materna ofrece muchos beneficios de salud para bebs y madres. Adems, ofrece una forma gratuita y conveniente de alimentar al beb. Para el beb  La primera leche (calostro) ayuda a mejorar el funcionamiento del aparato digestivo del beb.  Las clulas especiales de la leche (anticuerpos) ayudan a combatir las infecciones en el beb.  Los bebs que se alimentan con leche materna tambin tienen menos probabilidades de tener asma, alergias, obesidad o diabetes de tipo 2. Adems, tienen menor riesgo de sufrir el sndrome de muerte sbita del lactante (SMSL).  Los nutrientes de la leche materna son mejores para satisfacer las necesidades del beb en comparacin con la leche maternizada.  La leche materna mejora el desarrollo cerebral del beb. Para usted  La lactancia materna favorece el desarrollo de un vnculo muy especial entre la madre y el beb.  Es conveniente. La leche materna es econmica y siempre est disponible a la temperatura correcta.  La lactancia materna ayuda a quemar caloras. Le ayuda a perder el peso ganado durante el embarazo.  Hace que el tero vuelva al tamao que tena antes del embarazo ms rpido. Adems, disminuye el sangrado (loquios) despus del parto.  La lactancia materna contribuye a reducir el riesgo de tener diabetes de tipo 2,  osteoporosis, artritis reumatoide, enfermedades cardiovasculares y cncer de mama, ovario, tero y endometrio en el futuro. Informacin bsica sobre la lactancia Comienzo de la lactancia  Encuentre un lugar cmodo para sentarse o acostarse, con un buen respaldo para el cuello y la espalda.  Coloque una almohada o una manta enrollada debajo del beb para acomodarlo a la altura de la mama (si est sentada). Las almohadas para amamantar se han diseado especialmente a fin de servir de apoyo para los brazos y el beb mientras amamanta.  Asegrese de que la barriga del beb (abdomen) est frente a la suya.  Masajee suavemente la mama. Con las yemas de los dedos, masajee los bordes exteriores de la mama hacia adentro, en direccin al pezn. Esto estimula el flujo de leche. Si la leche fluye lentamente, es posible que deba continuar con este movimiento durante la lactancia.  Sostenga la mama con 4 dedos por debajo y el pulgar por arriba del pezn (forme la letra "C" con la mano). Asegrese de que los dedos se encuentren lejos del pezn y de la boca del beb.  Empuje suavemente los labios del beb con el pezn o con el dedo.  Cuando la boca del beb se abra lo suficiente, acrquelo rpidamente a la mama e introduzca todo el pezn y la arola, tanto como sea posible, dentro de la boca del beb. La arola es la zona de color que rodea al pezn. ? Debe haber ms arola visible por arriba del labio superior del beb que por debajo del labio inferior. ? Los labios del beb deben estar abiertos y extendidos hacia afuera (evertidos) para asegurar   que el beb se prenda de forma adecuada y cmoda. ? La lengua del beb debe estar entre la enca inferior y la mama.  Asegrese de que la boca del beb est en la posicin correcta alrededor del pezn (prendido). Los labios del beb deben crear un sello sobre la mama y estar doblados hacia afuera (invertidos).  Es comn que el beb succione durante 2 a 3 minutos  para que comience el flujo de leche materna. Cmo debe prenderse Es muy importante que le ensee al beb cmo prenderse adecuadamente a la mama. Si el beb no se prende adecuadamente, puede causar dolor en los pezones, reducir la produccin de leche materna y hacer que el beb tenga un escaso aumento de peso. Adems, si el beb no se prende adecuadamente al pezn, puede tragar aire durante la alimentacin. Esto puede causarle molestias al beb. Hacer eructar al beb al cambiar de mama puede ayudarlo a liberar el aire. Sin embargo, ensearle al beb cmo prenderse a la mama adecuadamente es la mejor manera de evitar que se sienta molesto por tragar aire mientras se alimenta. Signos de que el beb se ha prendido adecuadamente al pezn  Tironea o succiona de modo silencioso, sin causarle dolor. Los labios del beb deben estar extendidos hacia afuera (evertidos).  Se escucha que traga cada 3 o 4 succiones una vez que la leche ha comenzado a fluir (despus de que se produzca el reflejo de eyeccin de la leche).  Hay movimientos musculares por arriba y por delante de sus odos al succionar. Signos de que el beb no se ha prendido adecuadamente al pezn  Hace ruidos de succin o de chasquido mientras se alimenta.  Siente dolor en los pezones. Si cree que el beb no se prendi correctamente, deslice el dedo en la comisura de la boca y colquelo entre las encas del beb para interrumpir la succin. Intente volver a comenzar a amamantar. Signos de lactancia materna exitosa Signos del beb  El beb disminuir gradualmente el nmero de succiones o dejar de succionar por completo.  El beb se quedar dormido.  El cuerpo del beb se relajar.  El beb retendr una pequea cantidad de leche en la boca.  El beb se desprender solo del pecho. Signos que presenta usted  Las mamas han aumentado la firmeza, el peso y el tamao 1 a 3 horas despus de amamantar.  Estn ms blandas inmediatamente despus  de amamantar.  Se producen un aumento del volumen de leche y un cambio en su consistencia y color hacia el quinto da de lactancia.  Los pezones no duelen, no estn agrietados ni sangran. Signos de que su beb recibe la cantidad de leche suficiente  Mojar por lo menos 1 o 2paales durante las primeras 24horas despus del nacimiento.  Mojar por lo menos 5 o 6paales cada 24horas durante la primera semana despus del nacimiento. La orina debe ser clara o de color amarillo plido a los 5das de vida.  Mojar entre 6 y 8paales cada 24horas a medida que el beb sigue creciendo y desarrollndose.  Defeca por lo menos 3 veces en 24 horas a los 5 das de vida. Las heces deben ser blandas y amarillentas.  Defeca por lo menos 3 veces en 24 horas a los 7 das de vida. Las heces deben ser grumosas y amarillentas.  No registra una prdida de peso mayor al 10% del peso al nacer durante los primeros 3 das de vida.  Aumenta de peso un promedio de 4   a 7onzas (113 a 198g) por semana despus de los 4 das de vida.  Aumenta de peso, diariamente, de manera uniforme a partir de los 5 das de vida, sin registrar prdida de peso despus de las 2semanas de vida. Despus de alimentarse, es posible que el beb regurgite una pequea cantidad de leche. Esto es normal. Frecuencia y duracin de la lactancia El amamantamiento frecuente la ayudar a producir ms leche y puede prevenir dolores en los pezones y las mamas extremadamente llenas (congestin mamaria). Alimente al beb cuando muestre signos de hambre o si siente la necesidad de reducir la congestin de las mamas. Esto se denomina "lactancia a demanda". Las seales de que el beb tiene hambre incluyen las siguientes:  Aumento del estado de alerta, actividad o inquietud.  Mueve la cabeza de un lado a otro.  Abre la boca cuando se le toca la mejilla o la comisura de la boca (reflejo de bsqueda).  Aumenta las vocalizaciones, tales como sonidos de  succin, se relame los labios, emite arrullos, suspiros o chirridos.  Mueve la mano hacia la boca y se chupa los dedos o las manos.  Est molesto o llora. Evite el uso del chupete en las primeras 4 a 6 semanas despus del nacimiento del beb. Despus de este perodo, podr usar un chupete. Las investigaciones demostraron que el uso del chupete durante el primer ao de vida del beb disminuye el riesgo de tener el sndrome de muerte sbita del lactante (SMSL). Permita que el nio se alimente en cada mama todo lo que desee. Cuando el beb se desprende o se queda dormido mientras se est alimentando de la primera mama, ofrzcale la segunda. Debido a que, con frecuencia, los recin nacidos estn somnolientos las primeras semanas de vida, es posible que deba despertar al beb para alimentarlo. Los horarios de lactancia varan de un beb a otro. Sin embargo, las siguientes reglas pueden servir como gua para ayudarla a garantizar que el beb se alimenta adecuadamente:  Se puede amamantar a los recin nacidos (bebs de 4 semanas o menos de vida) cada 1 a 3 horas.  No deben transcurrir ms de 3 horas durante el da o 5 horas durante la noche sin que se amamante a los recin nacidos.  Debe amamantar al beb un mnimo de 8 veces en un perodo de 24 horas. Extraccin de leche materna     La extraccin y el almacenamiento de la leche materna le permiten asegurarse de que el beb se alimente exclusivamente de su leche materna, aun en momentos en los que no puede amamantar. Esto tiene especial importancia si debe regresar al trabajo en el perodo en que an est amamantando o si no puede estar presente en los momentos en que el beb debe alimentarse. Su asesor en lactancia puede ayudarla a encontrar un mtodo de extraccin que funcione mejor para usted y orientarla sobre cunto tiempo es seguro almacenar leche materna. Cmo cuidar las mamas durante la lactancia Los pezones pueden secarse, agrietarse y doler  durante la lactancia. Las siguientes recomendaciones pueden ayudarla a mantener las mamas humectadas y sanas:  Evite usar jabn en los pezones.  Use un sostn de soporte diseado especialmente para la lactancia materna. Evite usar sostenes con aro o sostenes muy ajustados (sostenes deportivos).  Seque al aire sus pezones durante 3 a 4minutos despus de amamantar al beb.  Utilice solo apsitos de algodn en el sostn para absorber las prdidas de leche. La prdida de un poco de leche materna entre   las tomas es normal.  Utilice lanolina sobre los pezones luego de amamantar. La lanolina ayuda a mantener la humedad normal de la piel. La lanolina pura no es perjudicial (no es txica) para el beb. Adems, puede extraer manualmente algunas gotas de leche materna y masajear suavemente esa leche sobre los pezones para que la leche se seque al aire. Durante las primeras semanas despus del nacimiento, algunas mujeres experimentan congestin mamaria. La congestin mamaria puede hacer que sienta las mamas pesadas, calientes y sensibles al tacto. El pico de la congestin mamaria ocurre en el plazo de los 3 a 5 das despus del parto. Las siguientes recomendaciones pueden ayudarla a aliviar la congestin mamaria:  Vace por completo las mamas al amamantar o extraer leche. Puede aplicar calor hmedo en las mamas (en la ducha o con toallas hmedas para manos) antes de amamantar o extraer leche. Esto aumenta la circulacin y ayuda a que la leche fluya. Si el beb no vaca por completo las mamas cuando lo amamanta, extraiga la leche restante despus de que haya finalizado.  Aplique compresas de hielo sobre las mamas inmediatamente despus de amamantar o extraer leche, a menos que le resulte demasiado incmodo. Haga lo siguiente: ? Ponga el hielo en una bolsa plstica. ? Coloque una toalla entre la piel y la bolsa de hielo. ? Coloque el hielo durante 20minutos, 2 o 3veces por da.  Asegrese de que el beb  est prendido y se encuentre en la posicin correcta mientras lo alimenta. Si la congestin mamaria persiste luego de 48 horas o despus de seguir estas recomendaciones, comunquese con su mdico o un asesor en lactancia. Recomendaciones de salud general durante la lactancia  Consuma 3 comidas y 3 colaciones saludables todos los das. Las madres bien alimentadas que amamantan necesitan entre 450 y 500 caloras adicionales por da. Puede cumplir con este requisito al aumentar la cantidad de una dieta equilibrada que realice.  Beba suficiente agua para mantener la orina clara o de color amarillo plido.  Descanse con frecuencia, reljese y siga tomando sus vitaminas prenatales para prevenir la fatiga, el estrs y los niveles bajos de vitaminas y minerales en el cuerpo (deficiencias de nutrientes).  No consuma ningn producto que contenga nicotina o tabaco, como cigarrillos y cigarrillos electrnicos. El beb puede verse afectado por las sustancias qumicas de los cigarrillos que pasan a la leche materna y por la exposicin al humo ambiental del tabaco. Si necesita ayuda para dejar de fumar, consulte al mdico.  Evite el consumo de alcohol.  No consuma drogas ilegales o marihuana.  Antes de usar cualquier medicamento, hable con el mdico. Estos incluyen medicamentos recetados y de venta libre, como tambin vitaminas y suplementos a base de hierbas. Algunos medicamentos, que pueden ser perjudiciales para el beb, pueden pasar a travs de la leche materna.  Puede quedar embarazada durante la lactancia. Si se desea un mtodo anticonceptivo, consulte al mdico sobre cules son las opciones seguras durante la lactancia. Dnde encontrar ms informacin: Liga internacional La Leche: [email or web address]. Comunquese con un mdico si:  Siente que quiere dejar de amamantar o se siente frustrada con la lactancia.  Sus pezones estn agrietados o sangran.  Sus mamas estn irritadas, sensibles o  calientes.  Tiene los siguientes sntomas: ? Dolor en las mamas o en los pezones. ? Un rea hinchada en cualquiera de las mamas. ? Fiebre o escalofros. ? Nuseas o vmitos. ? Drenaje de otro lquido distinto de la leche materna desde los pezones.  Sus mamas no   se llenan antes de amamantar al beb para el quinto da despus del [place].  Se siente triste y deprimida.  El beb: ? Est demasiado somnoliento como para comer bien. ? Tiene problemas para dormir. ? Tiene ms de 1 semana de vida y [employer] de 6 paales en un periodo de 24 horas. ? No ha aumentado de [place] a los [address] de [address]  El beb defeca menos de 3 veces en 24 horas.  La piel del beb o las partes blancas de los ojos se vuelven amarillentas. Solicite ayuda de inmediato si:  El beb est muy cansado Retail buyer) y no se quiere despertar para comer.  Le sube la fiebre sin causa. Resumen  La lactancia materna ofrece muchos beneficios de salud para bebs y [place].  Intente amamantar a su beb cuando muestre signos tempranos de hambre.  Haga cosquillas o empuje suavemente los labios del beb con el dedo o el pezn para lograr que el beb abra la boca. Acerque el beb a la mama. Asegrese de que la mayor parte de la arola se encuentre dentro de la boca del beb. Ofrzcale una mama y haga eructar al beb antes de pasar a la otra.  Hable con su mdico o asesor en lactancia si tiene dudas o problemas con la lactancia. Esta informacin no tiene Theme park manager el consejo del mdico. Asegrese de hacerle al mdico cualquier pregunta que tenga. Document Revised: 05/21/2017 Document Reviewed: 06/16/2016 Elsevier Patient Education  2020 Elsevier Inc. Consejos para la extraccin de la [place] materna con un sacaleche Breast Pumping Tips La extraccin de [place] materna con un sacaleche sirve para sacarse leche de las [place]. Luego, esa leche se almacena para que el beb la tome cuando usted no est en casa. Hay tres formas de  Public relations account executive. Puede hacer lo siguiente:  Hgase masajes con la mano y presione la mama (extraccin manual).  Extrigase la [employer] con un sacaleche de [place].  Extrigase la [employer] con un Counsellor. Es posible que al principio no Paramedic. Las [employer] deben comenzar a producir ms despus de [employer]. Extraerse [employer] puede contribuir a la produccin de Teaching laboratory technician despus del nacimiento. Tambin ayuda a continuar producindola cuando usted no est con el beb. Cundo debo extraerme [place]? Puede empezar con las extracciones inmediatamente despus de tener al beb. Siga estos consejos:  Cuando est con el beb: ? Extraiga leche despus de Museum/gallery exhibitions officer. ? Extraiga leche de la mama que tenga libre [place].  Cuando no est con el beb: ? Extrigase [employer], durante . ? Extraiga de ambas mamas al mismo tiempo si es posible.  Si el beb toma [employer], extrigase [employer] a la hora aproximada en la que el beb toma la [place].  Si bebe alcohol, espere 2horas antes de Public relations account executive.  Si va a someterse a [place], pregntele al mdico cundo puede volver a [employer]. Cmo me preparo para las extracciones? Reljese. Intente hacer lo siguiente para ayudar a que baje la leche:  Huela la sbana o la ropita del beb.  Mire una fotografa o un video del beb.  Sintese en un lugar que sea tranquilo y donde tenga privacidad.  Masajee la mama y el pezn.  Colquese un trapo en las mamas. El trapo debe estar caliente y algo hmedo.  Ponga msica que la relaje.  Visualice que Naval architect de sus pechos. Cules son algunos consejos? Consejos generales para extraerse [employer] con un  sacaleche   Siempre lvese las manos antes de la extraccin.  Si no obtiene Retail banker o si siente dolor durante el procedimiento, pruebe en diferentes entornos o con otro tipo de sacaleche.  Beba suficiente lquido  para mantener el pis (orina) claro o de color amarillo plido.  Use prendas que se abran al frente o que sean fciles de sacar.  Extraiga la [employer] en un envase o bibern limpios.  No consuma ningn producto que contenga nicotina o tabaco. Por ejemplo, cigarrillos comunes o electrnicos. Si necesita ayuda para dejar de fumar, consulte al [employer]. Consejos para el almacenamiento de la leche materna   Almacene la leche materna en envases limpios sin BPA. Esto incluye lo siguiente: ? Un bibern de plstico o vidrio. ? Lucile Shutters para [employer].  Almacene de 2a4onzas de [employer] en cada envase.  Revuelva la [place] [employer]. No la agite.  Escriba la fecha de extraccin en el envase.  Tiempos de almacenamiento de la leche materna: ? Temperatura ambiente: 6 a 8 horas. Es mejor consumirla en un plazo de 4horas. ? Enfriador con bolsas de hielo: 24 horas. ? Refrigerador: 5 a 8 das, si la [employer]. Es mejor consumirla en un plazo de 3das. ? Congelador: 9 a 12 meses, si la leche est limpia y se la coloca lejos de la puerta del Electrical engineer. Es mejor consumirla en un plazo de .  Coloque la [facility] parte posterior del refrigerador o Electrical engineer.  Para descongelar la [place], use agua tibia. No use el microondas. Consejos para elegir un sacaleche Cuando elija un sacaleche, recuerde los siguientes datos:  Los sacaleches manuales no necesitan electricidad. Cuestan menos. Pueden ser difciles de usar.  Los sacaleches elctricos usan electricidad. Son ms costosos. Son ms fciles de usar. Extraen ms leche.  La ventosa de succin (brida) debe ser del tamao adecuado.  Antes de comprar el sacaleche, consulte si el seguro lo cubre. Consejos para cuidar el sacaleche  Lea los consejos sobre limpieza en el manual del sacaleche.  Limpie el sacaleche despus de usarlo. Para hacer esto: ? Limpie la parte elctrica. Use un trapo seco o una toalla de papel.  No moje esta parte ni le aplique productos de limpieza. ? Lave las partes de plstico con agua tibia y [place]. O use el lavavajillas, si en el manual se indica que es seguro. No es necesario limpiar los tubos a menos que Production assistant, radio en contacto con la [place]. ? Deje que todas las piezas se sequen al aire. Evite secarlas con un pao o una toalla. ? Cuando las piezas estn limpias y secas, vuelva a Adult nurse. Luego, gurdelo.  Si queda agua en los tubos cuando desee extraerse leche: 1. Coloque los tubos en Oceanographer. 2. Justin Mend el sacaleche. 3. Apguelo cuando el tubo est seco.  No toque el interior de las piezas del sacaleche. Resumen  Extraerse [employer] puede contribuir a la produccin de [place] despus del nacimiento. Ayuda a continuar producindola cuando usted no est con el beb.  Cuando no est con el beb, extrigase [employer] unos , cada 2 o 3horas. Extraiga la [employer] de ambas mamas al mismo tiempo si es posible. Esta informacin no tiene Theme park manager el consejo del mdico. Asegrese de hacerle al mdico cualquier pregunta que tenga. Document Revised: 07/07/2018 Document Reviewed: 08/13/2016 Elsevier Patient Education  2020 [employer].  Parto vaginal, cuidados de puerperio Postpartum Care After Vaginal Delivery Lea esta informacin  sobre cmo cuidarse desde el momento en que nazca su beb y Elaina [place] 6 a 12 semanas despus del parto (perodo del posparto). El mdico tambin podr darle instrucciones ms especficas. Comunquese con su mdico si tiene problemas o preguntas. Siga estas indicaciones en su casa: Hemorragia vaginal  Es normal tener un poco de hemorragia vaginal (loquios) despus del parto. Use un apsito sanitario para el sangrado vaginal y secrecin. ? Durante la primera semana despus del parto, la cantidad y el aspecto de los loquios a menudo es similar a las del perodo menstrual. ? Durante las siguientes semanas disminuir  gradualmente hasta convertirse en una secrecin seca amarronada o [place]amar[place]enta. ? En la Lennar Corporationmayora de las mujeres, los loquios se detienen Guardian Life Insurancecompletamente entre 4 a 6semanas despus del Franquezparto. Los sangrados vaginales pueden variar de mujer a Nurse, learning disabilitymujer.  Cambie los apsitos sanitarios con frecuencia. Observe si hay cambios en el flujo, como: ? Un aumento repentino en el volumen. ? Cambio en el color. ? Cogulos sanguneos grandes.  Si expulsa un cogulo de sangre por la vagina, gurdelo y llame al mdico para informrselo. No deseche los cogulos de sangre por el inodoro antes de hablar con su mdico.  No use tampones ni se haga duchas vaginales hasta que el mdico la autorice.  Si no est amamantando, volver a tener su perodo entre 6 y 8 semanas despus del parto. Si solamente alimenta al beb con Alexis Goodellleche materna (lactancia materna exclusiva), podra no volver a tener su perodo hasta que deje de Delanoamamantar. Cuidados perineales  Mantenga la zona entre la vagina y el ano (perineo) limpia y [place]seca, [place]como se lo haya indicado el mdico. Utilice apsitos o aerosoles analgsicos y Control and instrumentation engineercremas, como se lo hayan indicado.  Si le hicieron un corte en el perineo (episiotoma) o tuvo un desgarro en la vagina, controle la zona para detectar signos de infeccin hasta que sane. Est atenta a los siguientes signos: ? Aumento del enrojecimiento, la hinchazn o Chief Technology Officerel dolor. ? Presenta lquido o sangre que supura del corte o Insurance account managerdesgarro. ? Calor. ? Pus o mal olor.  Es posible que le den una botella rociadora para que use en lugar de limpiarse el rea con papel higinico despus de usar el bao. Cuando comience a Barrister's clerksanar, podr usar la botella rociadora antes de secarse. Asegrese de secarse suavemente.  Para aliviar el dolor causado por una episiotoma, un desgarro en la vagina o venas hinchadas en el ano (hemorroides), trate de tomar un bao de asiento tibio 2 o 3 veces por da. Un bao de asiento es un bao de agua tibia que se toma  mientras se est sentado. El agua solo debe Adult nursellegar hasta las caderas y cubrir las nalgas. Cuidado de las [address]  En los [address]os das despus del parto, las mamas pueden sentirse pesadas, llenas e incmodas (congestin [place]amaria). Tambin puede escaparse leche de sus senos. El mdico puede sugerirle mtodos para Emergency planning/management officeraliviar este malestar. La congestin mamaria debera desaparecer al cabo de The Mutual of Omahaunos das.  Si est amamantando: ? Use un sostn que sujete y ajuste bien sus pechos. ? Mantenga los pezones secos y limpios. Aplquese cremas y ungentos, como se lo haya indicado el mdico. ? Es posible que deba usar discos de algodn en el sostn para Environmental health practitionerabsorber la [place] Lag[place]che que se filtre de sus senos. ? Puede tener contracciones uterinas cada vez que amamante durante varias semanas despus del [place]parto. Las contracciones uterinas ayudan al tero a Hotel managerregresar a su tamao habitual. ? Si tiene algn problema con la  lactancia materna, colabore con el mdico o un Lobbyist.  Si no est amamantando: ? Evite tocarse [employer]. Al hacerlo, podran producir ms leche. ? Use un sostn que le proporcione el ajuste correcto y compresas fras para reducir la hinchazn. ? No extraiga (saque) [employer]. Esto har que produzca ms [employer]. Intimidad y sexualidad  Pregntele al mdico cundo puede retomar la actividad sexual. Esto puede depender de lo siguiente: ? Su riesgo de sufrir infecciones. ? La rapidez con la que est sanando. ? Su comodidad y deseo de retomar la actividad sexual.  Despus del parto, puede quedar embarazada incluso si no ha tenido todava su perodo. Si lo desea, hable con el mdico acerca de los mtodos de control de la natalidad (mtodos anticonceptivos). Medicamentos  [employer] de venta libre y los recetados solamente como se lo haya indicado el mdico.  Si le recetaron un antibitico, tmelo como se lo haya indicado el mdico. No deje de tomar el antibitico aunque comience a  sentirse mejor. Actividad  Retome sus actividades normales de a poco como se lo haya indicado el mdico. Pregntele al mdico qu actividades son seguras para usted.  Descanse todo lo que pueda. Trate de descansar o tomar una siesta mientras el beb duerme. Comida y bebida   Beba suficiente lquido como para Theatre manager la orina de color amarillo plido.  Coma alimentos ricos en [employer]. Estos pueden ayudarla a prevenir o Cytogeneticist. Los alimentos ricos en fibras incluyen, entre otros: ? Panes y cereales integrales. ? Arroz integral. ? Optometrist. ? Lambert Mody y verduras frescas.  No intente perder de peso rpidamente reduciendo el consumo de caloras.  Tome sus vitaminas prenatales hasta la visita de seguimiento de posparto o hasta que su mdico le indique que puede dejar de tomarlas. Estilo de vida  No consuma ningn producto que contenga nicotina o tabaco, como cigarrillos y Psychologist, sport and exercise. Si necesita ayuda para dejar de fumar, consulte al mdico.  No beba alcohol, especialmente si est amamantando. Instrucciones generales  Concurra a todas las visitas de seguimiento para usted y el beb, como se lo haya indicado el mdico. La mayora de las mujeres visita al mdico para un seguimiento de posparto dentro de las primeras 3 a 6 semanas despus del parto. Comunquese con un mdico si:  Se siente incapaz de controlar los cambios que implica tener un hijo y esos sentimientos no desaparecen.  Siente tristeza o preocupacin de forma [place] mamas se ponen rojas, le duelen o se endurecen.  Tiene fiebre.  Tiene dificultad para retener la [place] o para impedir que la orina se escape.  Tiene poco inters o falta de inters en actividades que solan gustarle.  No ha amamantado nada y no ha tenido un perodo menstrual durante 12 semanas despus del Central.  Dej de amamantar al beb y no ha tenido su perodo menstrual durante 12 semanas despus de  dejar de Economist.  Tiene preguntas sobre su cuidado y el del beb.  Elimina un cogulo de sangre grande por la vagina. Solicite ayuda de inmediato si:  Electronics engineer.  Tiene dificultad para respirar.  Tiene un dolor repentino e intenso en la pierna.  Tiene dolor intenso o clicos en el la parte inferior del abdomen.  Tiene una hemorragia tan intensa de la vagina que empapa ms de un apsito en [employer]. El sangrado no debe ser ms abundante que el perodo ms intenso que haya  tenido.  Dolor de cabeza intenso.  Se desmaya.  Tiene visin borrosa o Nurse, adult.  Tiene secrecin vaginal con mal olor.  Tiene pensamientos acerca de lastimarse a usted misma o a su beb. Si alguna vez siente que puede lastimarse a usted misma o a Economist, o tiene pensamientos de poner fin a su vida, busque ayuda de inmediato. Puede dirigirse al departamento de emergencias ms cercano o llamar a:  El servicio de emergencias de su localidad (911 en [place]  Una lnea de asistencia al suicida y Visual merchandiser en crisis, como la [employer] de Prevencin del Suicidio (National Suicide Prevention Lifeline), al [phone]. Est disponible las 24 horas del da. Resumen  El perodo de tiempo justo despus el parto y Elaina Hoops 6 a 12 semanas despus del parto se denomina perodo posparto.  Retome sus actividades normales de a Naval architect se lo haya indicado el mdico.  Concurra a todas las visitas de seguimiento para usted y Dance movement psychotherapist beb, como se lo haya indicado el mdico. Esta informacin no tiene Theme park manager el consejo del mdico. Asegrese de hacerle al mdico cualquier pregunta que tenga. Document Revised: 06/06/2017 Document Reviewed: 02/15/2017 Elsevier Patient Education  2020 [employer].

## 2019-07-06 NOTE — Discharge Summary (Signed)
Physician Obstetric Discharge Summary  Patient ID: [name] Black MRN: [id number] DOB/AGE: [birth year] 19 y.o.   Date of Admission: 07/06/2019 Date of Delivery: 07/06/2019 Date of Discharge: 07/08/2019  Admitting Diagnosis: Preterm premature rupture of membranes at [redacted]w[redacted]d  Secondary Diagnosis: latent TB  Mode of Delivery: normal spontaneous vaginal delivery 07/06/2019      Discharge Diagnosis: Preterm vaginal delivery at [facility]. Meconium stained amniotic fluid   Intrapartum Procedures: epidural, GBS prophylaxis, pitocin augmentation and AROM of forebag   Post partum procedures: none  Complications: labial laceration   Brief Hospital Course  [name] Black is a G1P0 who had a SVD on 07/06/2019;  for further details of this delivery, please refer to the delivery note.  Patient had an uncomplicated postpartum course.  By time of discharge on PPD#2, her pain was controlled on oral pain medications; she had appropriate lochia and was ambulating, voiding without difficulty and tolerating regular diet.  She was deemed stable for discharge to home. Breastfeeding going well using nipple shield and with assistance from [facility].  Labs: CBC Latest Ref Rng & Units 07/07/2019 07/06/2019 07/06/2019  WBC 4.0 - 10.5 K/uL 9.5 13.0(H) 13.9(H)  Hemoglobin 12.0 - 15.0 g/dL 10.0(L) 12.0 11.8(L)  Hematocrit 36.0 - 46.0 % 30.2(L) 35.9(L) 34.8(L)  Platelets 150 - 400 K/uL 233 274 270   O POS Performed at [facility], [address] [place], [place] [address]   Physical exam:  Blood pressure 115/70, pulse 66, temperature 98 F (36.7 C), temperature source Oral, resp. rate 20, height 5' (1.524 m), weight 73.9 kg, last menstrual period 11/05/2018, SpO2 98 %, unknown if currently breastfeeding. General: alert and no distress Lochia: appropriate Abdomen: soft, NT Uterine Fundus: firm Incision: NA, labial laceration healing Extremities: No evidence of DVT seen on physical exam. No lower extremity  edema.  Discharge Instructions: Per After Visit Summary. Activity: Advance as tolerated. Pelvic rest for 6 weeks.  Also refer to Discharge Instructions Diet: Regular Medications: Allergies as of 07/08/2019   No Known Allergies     Medication List    STOP taking these medications   promethazine 25 MG tablet Commonly known as: PHENERGAN     TAKE these medications   ferrous sulfate 325 (65 FE) MG tablet Take 1 tablet (325 mg total) by mouth daily with breakfast.   Prenatal Vitamin 27-0.8 MG Tabs Take 1 tablet by mouth daily.      Outpatient follow up:  Follow-up Information    [facility] DEPT. Go on 07/20/2019.   Why: for a 2 week postpartum mood check at 3:20  Contact information: [address] [place] [address] [phone]         Postpartum contraception: undecided- will discuss at postpartum visit at [facility]  Discharged Condition: good  Discharged to: home   Newborn Data: Baby girl [name] Black  Disposition:home with mother  Apgars: APGAR (1 MIN): 8   APGAR (5 MINS): 9   Weight: 2390g, 5 pounds 4 ounces  Baby Feeding: Breast with some supplemental formula  Tresea Mall, CNM 07/08/2019 9:19 AM

## 2019-07-06 NOTE — Anesthesia Procedure Notes (Signed)
Epidural Patient location during procedure: OB Start time: 07/06/2019 8:17 AM End time: 07/06/2019 8:34 AM  Staffing Anesthesiologist: Alver Fisher, MD Performed: anesthesiologist   Preanesthetic Checklist Completed: patient identified, IV checked, site marked, risks and benefits discussed, surgical consent, monitors and equipment checked, pre-op evaluation and timeout performed  Epidural Patient position: sitting Prep: ChloraPrep Patient monitoring: heart rate, continuous pulse ox and blood pressure Approach: midline Location: L3-L4 Injection technique: LOR saline  Needle:  Needle type: Tuohy  Needle gauge: 18 G Needle length: 9 cm and 9 Needle insertion depth: 5 cm Catheter type: closed end flexible Catheter size: 20 Guage Catheter at skin depth: 9 cm Test dose: negative (0.125% bupivacaine)  Assessment Events: blood not aspirated, injection not painful, no injection resistance, no paresthesia and negative IV test  Additional Notes   Patient tolerated the insertion well without complications.Reason for block:procedure for pain

## 2019-07-07 ENCOUNTER — Encounter: Payer: Self-pay | Admitting: Obstetrics and Gynecology

## 2019-07-07 LAB — CBC
HCT: 30.2 % — ABNORMAL LOW (ref 36.0–46.0)
Hemoglobin: 10 g/dL — ABNORMAL LOW (ref 12.0–15.0)
MCH: 28.8 pg (ref 26.0–34.0)
MCHC: 33.1 g/dL (ref 30.0–36.0)
MCV: 87 fL (ref 80.0–100.0)
Platelets: 233 10*3/uL (ref 150–400)
RBC: 3.47 MIL/uL — ABNORMAL LOW (ref 3.87–5.11)
RDW: 12.9 % (ref 11.5–15.5)
WBC: 9.5 10*3/uL (ref 4.0–10.5)
nRBC: 0 % (ref 0.0–0.2)

## 2019-07-07 NOTE — Lactation Note (Signed)
This note was copied from a baby's chart. Lactation Consultation Note  Patient Name: [name] Black [id number] Date: 07/07/2019 Reason for consult: Follow-up assessment;1st time breastfeeding;Late-preterm 34-36.6wks;Nipple pain/trauma(pinching with latch)  LC in to see mom and baby. Baby just finished bath and was showing early hunger cues. Mom reports her mother giving formula bottles overnight, but desiring to breastfeed right now. LC assisted with getting baby into cross-cradle position, nose to nipple, and tummy turned in. Baby opened wide but was unable to sustain latch and mom reporting pinching and pain. NS, size 16, placed, baby returned to breast and sustained latch. Mom reports improved feeling, still some slight tenderness feeling. Baby had strong rhythmic sucking pattern, but efforts from mom and LC to keep baby awake during feeding.  LC discussed feeding plan with mom today: feeding at breast on cue, use of nipple shield, supplement PRN, and utilize DEBP in room. LC reviewed pump set-up, turn on, use, and cleaning, and benefits of use. Encouraged skin to skin as frequent as possible today to aid in helping with milk production, and discussed transient nipple pain and discomfort. Also provided information for pediatric dentist due to pain with latching, and heart shaped tongue of infant, also encouraged mom to follow-up with her pediatrician.  Mom has WIC, and plans to continue BF support through [facility] and possible pump obtainment if needed. LC name/number on whiteboard, encouraged to call at next feeding for assistance/observation.  Maternal Data Formula Feeding for Exclusion: No Has patient been taught Hand Expression?: Yes Does the patient have breastfeeding experience prior to this delivery?: No  Feeding Feeding Type: Breast Fed  LATCH Score Latch: Repeated attempts needed to sustain latch, nipple held in mouth throughout feeding, stimulation needed to elicit sucking  reflex.  Audible Swallowing: None  Type of Nipple: Everted at rest and after stimulation  Comfort (Breast/Nipple): Filling, red/small blisters or bruises, mild/mod discomfort(mom reports some pinching/pain)  Hold (Positioning): Assistance needed to correctly position infant at breast and maintain latch.  LATCH Score: 5  Interventions Interventions: Breast feeding basics reviewed;Assisted with latch;Hand express;Breast compression;Adjust position;Support pillows;Coconut oil  Lactation Tools Discussed/Used Tools: Nipple Shields Nipple shield size: 16   Consult Status Consult Status: Follow-up Date: 07/07/19 Follow-up type: In-patient    Danford Bad 07/07/2019, 10:46 AM

## 2019-07-07 NOTE — Progress Notes (Signed)
Subjective:  Doing well postpartum day 1: she is tolerating regular diet, her pain is controlled with PO medications, she is ambulating and voiding without difficulty. She reports starting feeds at breast and baby is sleepy. She is supplementing with formula. Encouraged regular stimulation of breast to establish supply.   Objective:  Vital signs in last 24 hours: Temp:  [97.9 F (36.6 C)-99.4 F (37.4 C)] 97.9 F (36.6 C) (04/29 0731) Pulse Rate:  [67-173] 68 (04/29 0731) Resp:  [18-20] 20 (04/29 0731) BP: (81-134)/(42-88) 114/65 (04/29 0731) SpO2:  [97 %-100 %] 100 % (04/29 0731)    General: NAD Pulmonary: no increased work of breathing Abdomen: non-distended, non-tender, fundus firm at level of umbilicus Extremities: no edema, no erythema, no tenderness  Results for orders placed or performed during the hospital encounter of 07/06/19 (from the past 72 hour(s))  CBC     Status: Abnormal   Collection Time: 07/06/19  2:32 AM  Result Value Ref Range   WBC 13.9 (H) 4.0 - 10.5 K/uL   RBC 4.07 3.87 - 5.11 MIL/uL   Hemoglobin 11.8 (L) 12.0 - 15.0 g/dL   HCT 34.8 (L) 36.0 - 46.0 %   MCV 85.5 80.0 - 100.0 fL   MCH 29.0 26.0 - 34.0 pg   MCHC 33.9 30.0 - 36.0 g/dL   RDW 12.5 11.5 - 15.5 %   Platelets 270 150 - 400 K/uL   nRBC 0.0 0.0 - 0.2 %    Comment: Performed at [facility], [address] [place], [place] [address]  Type and screen [place]     Status: None   Collection Time: 07/06/19  2:32 AM  Result Value Ref Range   ABO/RH(D) O POS    Antibody Screen NEG    Sample Expiration      07/09/2019,2359 Performed at [facility], [address] [place], [place] [address]   RPR     Status: None   Collection Time: 07/06/19  2:32 AM  Result Value Ref Range   RPR Ser Ql NON REACTIVE NON REACTIVE    Comment: Performed at [facility], [address], [place], [place] [address]  Group B strep by PCR     Status: None   Collection Time: 07/06/19  2:32 AM   Specimen: Vaginal/Rectal; Genital  Result Value Ref Range   Group B strep by PCR NEGATIVE NEGATIVE    Comment: (NOTE) Intrapartum testing with Xpert GBS assay should be used as an adjunct to other methods available and not used to replace antepartum testing (at 35-[redacted] weeks gestation). Performed at [facility], [address] [place], [place] [address]   [place] rt PCR [facility] only)     Status: None   Collection Time: 07/06/19  2:32 AM   Specimen: Vaginal/Rectal  Result Value Ref Range   Specimen source GC/Chlam URINE, RANDOM    Chlamydia Tr NOT DETECTED NOT DETECTED   N gonorrhoeae NOT DETECTED NOT DETECTED    Comment: (NOTE) This CT/NG assay has not been evaluated in patients with a history of  hysterectomy. Performed at [facility], [address] [place], [place] [address]   Respiratory Panel by RT PCR (Flu A&B, Covid) - Vaginal/Rectal     Status: None   Collection Time: 07/06/19  2:33 AM   Specimen: Vaginal/Rectal  Result Value Ref Range   SARS Coronavirus 2 by RT PCR NEGATIVE NEGATIVE    Comment: (NOTE) SARS-CoV-2 target nucleic acids are  NOT DETECTED. The SARS-CoV-2 RNA is generally detectable in upper respiratoy specimens during the acute phase of infection. The lowest concentration of SARS-CoV-2 viral copies this assay can detect is 131 copies/mL. A negative result does not preclude SARS-Cov-2 infection and should not be used as the sole basis for treatment or other patient management decisions. A negative result may occur with  improper specimen collection/handling, submission of specimen other than nasopharyngeal swab, presence of viral mutation(s) within the areas targeted by this assay, and inadequate number of viral copies (<131 copies/mL). A negative result must be combined with clinical observations, patient history, and epidemiological information. The expected result is Negative. Fact Sheet  for Patients:  PinkCheek.be Fact Sheet for Healthcare Providers:  GravelBags.it This test is not yet ap proved or cleared by the [place] FDA and  has been authorized for detection and/or diagnosis of SARS-CoV-2 by FDA under an Emergency Use Authorization (EUA). This EUA will remain  in effect (meaning this test can be used) for the duration of the COVID-19 declaration under Section 564(b)(1) of the Act, 21 U.S.C. section 360bbb-3(b)(1), unless the authorization is terminated or revoked sooner.    Influenza A by PCR NEGATIVE NEGATIVE   Influenza B by PCR NEGATIVE NEGATIVE    Comment: (NOTE) The Xpert Xpress SARS-CoV-2/FLU/RSV assay is intended as an aid in  the diagnosis of influenza from Nasopharyngeal swab specimens and  should not be used as a sole basis for treatment. Nasal washings and  aspirates are unacceptable for Xpert Xpress SARS-CoV-2/FLU/RSV  testing. Fact Sheet for Patients: PinkCheek.be Fact Sheet for Healthcare Providers: GravelBags.it This test is not yet approved or cleared by the [place] FDA and  has been authorized for detection and/or diagnosis of SARS-CoV-2 by  FDA under an Emergency Use Authorization (EUA). This EUA will remain  in effect (meaning this test can be used) for the duration of the  Covid-19 declaration under Section 564(b)(1) of the Act, 21  U.S.C. section 360bbb-3(b)(1), unless the authorization is  terminated or revoked. Performed at [facility], [address] [place], [place] [address]   ROM Plus [facility] only)     Status: None   Collection Time: 07/06/19  6:32 AM  Result Value Ref Range   Rom Plus POSITIVE     Comment: Performed at [facility], [address] [place], [place] [address]  ABO/Rh     Status: None   Collection Time: 07/06/19  6:33 AM  Result Value Ref Range   ABO/RH(D)      O  POS Performed at [facility], [address] [place], [place] [address]   CBC     Status: Abnormal   Collection Time: 07/06/19  8:08 AM  Result Value Ref Range   WBC 13.0 (H) 4.0 - 10.5 K/uL   RBC 4.23 3.87 - 5.11 MIL/uL   Hemoglobin 12.0 12.0 - 15.0 g/dL   HCT 35.9 (L) 36.0 - 46.0 %   MCV 84.9 80.0 - 100.0 fL   MCH 28.4 26.0 - 34.0 pg   MCHC 33.4 30.0 - 36.0 g/dL   RDW 12.6 11.5 - 15.5 %   Platelets 274 150 - 400 K/uL   nRBC 0.0 0.0 - 0.2 %    Comment: Performed at [facility], [address] [place], [place] [address]  CBC     Status: Abnormal   Collection Time: 07/07/19  5:24 AM  Result Value Ref Range   WBC 9.5 4.0 - 10.5 K/uL  RBC 3.47 (L) 3.87 - 5.11 MIL/uL   Hemoglobin 10.0 (L) 12.0 - 15.0 g/dL   HCT 30.2 (L) 36.0 - 46.0 %   MCV 87.0 80.0 - 100.0 fL   MCH 28.8 26.0 - 34.0 pg   MCHC 33.1 30.0 - 36.0 g/dL   RDW 12.9 11.5 - 15.5 %   Platelets 233 150 - 400 K/uL   nRBC 0.0 0.0 - 0.2 %    Comment: Performed at [facility], [address] [place], [place] [address]    Assessment:   19 y.o. G1P0101 postpartum day # 1, lactating  Plan:    1) Acute blood loss anemia - hemodynamically stable and asymptomatic - po ferrous sulfate  2) Blood Type: O+ Performed at [facility], [address] [place], [place] [address]  [phone]) / Rubella: MMR x2, Varivax x2  3) TDAP status: given antepartum  4) Feeding plan: Breast and Formula   5)  Education given regarding options for contraception, as well as compatibility with breast feeding if applicable.  Patient is undecided what she will use for contraception.  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

## 2019-07-07 NOTE — Lactation Note (Signed)
This note was copied from a baby's chart. Lactation Consultation Note  Patient Name: [name] Black [id number] Date: 07/07/2019 Reason for consult: Follow-up assessment;Difficult latch;1st time breastfeeding;Late-preterm 34-36.6wks  LC assisted mom with waking, stimulation, and latching of baby to the right breast. First attempted latching without nipple shield; baby latched with wide open mouth, but mom in significant pain- crying out. Baby removed by LC, nipple shield placed, and baby re-latched easily, flanged top/bottom lips, and rhythmic sucking pattern. Mom reports improved feeling, tolerable, but still slight pinching. Baby continued to breastfeed actively with [facility] in room.  LC spoke with mom and encouraged to follow-up in the AM with pediatrician for possible restriction within the mouth that could be causing the pain, encouraged pumping for stimulation and milk removal with use of nipple shield and baby's weeks of gestation at only 33 (mom had been set-up with a pump overnight, re-educated by LC this AM on use and frequency, but mom has still not utilized the pump). Encouraged to offer breast first before supplement to encourage the onset of a good milk supply. Current feeding plan includes: feeding at breast first, offering supplement as needed, and using DEBP every 2-3 hours for additional stimulation.  LC to follow-up tomorrow.  Maternal Data Formula Feeding for Exclusion: No Has patient been taught Hand Expression?: Yes Does the patient have breastfeeding experience prior to this delivery?: No  Feeding Feeding Type: Breast Fed  LATCH Score Latch: Grasps breast easily, tongue down, lips flanged, rhythmical sucking.  Audible Swallowing: A few with stimulation  Type of Nipple: Everted at rest and after stimulation  Comfort (Breast/Nipple): Soft / non-tender  Hold (Positioning): Assistance needed to correctly position infant at breast and maintain latch.  LATCH Score:  8  Interventions Interventions: Breast feeding basics reviewed;Assisted with latch;Support pillows;Adjust position  Lactation Tools Discussed/Used Tools: Nipple Shields Nipple shield size: 16   Consult Status Consult Status: Follow-up Date: 07/08/19 Follow-up type: In-patient    Danford Bad 07/07/2019, 4:39 PM

## 2019-07-07 NOTE — Clinical Social Work Maternal (Signed)
CLINICAL SOCIAL WORK MATERNAL/CHILD NOTE  Patient Details  Name: [name] Black MRN: [id number] Date of Birth: 07/03/2000  Date:  07/07/2019  Clinical Social Worker Initiating Note:  Layton Naves, LCSW Date/Time: Initiated:  07/07/19/      Child's Name:  [name] Black   Biological Parents:  Mother, Father   Need for Interpreter:  None   Reason for Referral:  Behavioral Health Concerns   Address:  [address] [place] [address]    Phone number:  [phone] (home)     Additional phone number: None reported.  Household Members/Support Persons (HM/SP):   Household Member/Support Person 1, Household Member/Support Person 2, Household Member/Support Person 3   HM/SP Name Relationship DOB or Age  HM/SP -1 Jose Alejandro Valladares Alrarenga FOB unknown  HM/SP -2 Reyna FOB's Mother Unknown  HM/SP -3 Elmer FOB's Father Unknown  HM/SP -4        HM/SP -5        HM/SP -6        HM/SP -7        HM/SP -8          Natural Supports (not living in the home):  Extended Family, Friends, Immediate Family   Professional Supports:     Employment: Unemployed   Type of Work:     Education:  High school graduate   Homebound arranged:    Financial Resources:      Other Resources:  WIC   Cultural/Religious Considerations Which May Impact Care:  None reported.  Strengths:  Ability to meet basic needs , Compliance with medical plan , Home prepared for child , Understanding of illness, Pediatrician chosen   Psychotropic Medications:         Pediatrician:    [place]  Pediatrician List:   [place]    [place]    [place] [facility]  [place]    [place]    [place]      Pediatrician Fax Number:    Risk Factors/Current Problems:  None   Cognitive State:  Able to Concentrate , Alert , Goal Oriented    Mood/Affect:  Calm , Happy , Comfortable    CSW Assessment: CSW consulted due to  MOB having history of depression and suicide attempt at age 13. Per policy patient is not considered a teenage patient. CSW spoke with RN Abby prior to meeting with family.  CSW met with MOB at bedside. Explained HIPPA and MOB elected for FOB (Jose Alejandro Valladares Alrarenga) to remain at bedside during assessment. MOB was alert and appropriate during assessment. MOB and FOB were attentive to Baby [name].   MOB reported MOB, FOB, and Baby [name] will be living with FOB's parents. MOB reported she receives WIC and will let her worker know of Baby's birth. MOB reported she has a crib, diapers, clothes, new car seat, and all other items needed for Baby. MOB asked about getting an extra pack of newborn sized diapers to take home when they are discharged, she will ask RN about this. FOB reported they will use [facility] for Baby's Medical Care. She reported FOB's brother provides transportation to appointments for them.  MOB confirmed a history of depression. MOB reported she took medications for depression and saw a therapist after event at age 13. MOB reported currently she is coping well emotionally and does not have any active mental health treatments. She denied current SI or HI and denied DV history.   MOB reported that she feels she has a good support system including FOB and other family/friends.  MOB is aware of resources if she does need mental health support.   CSW provided education and information sheets on PPD and SIDS. MOB verbalized understanding and agreement to reach out to her Provider if she has any questions or needs, even after discharge.   MOB and FOB denied any needs at this time and were encouraged to reach out if needs arise.  CSW Plan/Description:  Sudden Infant Death Syndrome (SIDS) Education, Perinatal Mood and Anxiety Disorder (PMADs) Education, No Further Intervention Required/No Barriers to Discharge    Ramez Arrona E Benno Brensinger, LCSW 07/07/2019, 11:41 AM 

## 2019-07-08 LAB — CHLAMYDIA/GC NAA, CONFIRMATION
Chlamydia trachomatis, NAA: NEGATIVE
Neisseria gonorrhoeae, NAA: NEGATIVE

## 2019-07-08 NOTE — Anesthesia Postprocedure Evaluation (Signed)
Anesthesia Post Note  Patient: [name] Black  Procedure(s) Performed: AN AD HOC LABOR EPIDURAL  Patient location during evaluation: Mother Baby Anesthesia Type: Epidural Level of consciousness: awake and alert Pain management: pain level controlled Vital Signs Assessment: post-procedure vital signs reviewed and stable Respiratory status: spontaneous breathing, nonlabored ventilation and respiratory function stable Cardiovascular status: stable Postop Assessment: no headache, no backache and epidural receding Anesthetic complications: no     Last Vitals:  Vitals:   07/07/19 2032 07/07/19 2348  BP: 118/76 106/62  Pulse: 69 73  Resp: 20 18  Temp: 37.4 C 36.7 C  SpO2: 98% 100%    Last Pain:  Vitals:   07/08/19 0415  TempSrc:   PainSc: 5                  Jules Schick

## 2019-07-08 NOTE — Progress Notes (Signed)
Pt discharged with infant. Discharge instructions, prescriptions, and follow up appointments given to and reviewed with patient. Pt verbalized understanding. Escorted out by staff. 

## 2019-07-08 NOTE — Plan of Care (Signed)
Vs stable; up ad lib; taking motrin and tylenol for pain control; breastfeeding and formula feeding baby; uses 72mm nipple shield; does need some assistance with breastfeeding

## 2019-07-08 NOTE — Lactation Note (Signed)
This note was copied from a baby's chart. Mom and baby being discharged, mom breastfeeding with nipple shield approx 1 hr with swallows per mom, encouraged to offer both breasts at a feeding, mom plans on obtaining a breast pump from [facility], she called them yesterday but did not get a call back. Mom is supplementing with formula after breastfeeding.  I faxed a referral form to [facility] that mom would be interested in obtaining an electric breast pump.

## 2019-07-08 NOTE — Progress Notes (Signed)
Pt watched infant CPR video. All questions answered.

## 2019-07-09 LAB — CULTURE, BETA STREP (GROUP B ONLY): Strep Gp B Culture: NEGATIVE

## 2019-07-12 ENCOUNTER — Ambulatory Visit: Payer: Self-pay

## 2019-07-18 ENCOUNTER — Telehealth: Payer: Self-pay | Admitting: Licensed Clinical Social Worker

## 2019-07-18 NOTE — Telephone Encounter (Signed)
LCSW spoke with patient and conducted postpartum mood check. LCSW assessed patient's symptoms and provided psychoeducation on postpartum mood and anxiety disorders. Patient endorsed anxiety symptoms, anxious thoughts, worries about babies wellbeing, hypervigilance- often checking on baby leading to inability to sleep, sleep disturbance. Patient denies any depressive symptoms, intrusive thoughts, or any psychotic thought patterns. Patient desires to cancel appointment scheduled with maternity and has scheduled appt. With LCSW for 08/04/2019. LCSW was encouraged to seek services sooner if symptoms worsen.

## 2019-07-20 ENCOUNTER — Ambulatory Visit: Payer: Self-pay

## 2019-07-20 ENCOUNTER — Other Ambulatory Visit: Payer: Self-pay

## 2019-08-04 ENCOUNTER — Ambulatory Visit: Payer: Self-pay | Admitting: Licensed Clinical Social Worker

## 2019-08-18 ENCOUNTER — Ambulatory Visit: Payer: Self-pay | Admitting: Licensed Clinical Social Worker

## 2019-08-19 ENCOUNTER — Telehealth: Payer: Self-pay

## 2019-08-19 NOTE — Telephone Encounter (Signed)
TC from patient.  Requesting PP appt and to start Rifampin for LTBI.  RN will update EPI before starting Rifampin.  Patient recently delivered 07/06/19 Richmond Campbell, RN

## 2019-08-22 ENCOUNTER — Other Ambulatory Visit: Payer: Self-pay

## 2019-08-22 ENCOUNTER — Encounter: Payer: Self-pay | Admitting: Advanced Practice Midwife

## 2019-08-22 ENCOUNTER — Ambulatory Visit: Payer: Self-pay | Admitting: Advanced Practice Midwife

## 2019-08-22 LAB — HEMOGLOBIN, FINGERSTICK: Hemoglobin: 11.7 g/dL (ref 11.1–15.9)

## 2019-08-22 NOTE — Progress Notes (Signed)
Post Partum Exam  [name] Black is a 19 y.o. G1P0101 SHF who presents for a postpartum visit. PPROM with SVD 07/06/19 F 5#3 with labial laceration.  No menses pp.  PP sex x1 yesterday with condom.  Breast feeding only at night and bottlefeeding during day q 2-3 hrs (3 oz).  Baby weighed 8#3 on 08/05/19.  Partner is helping her with baby.  She is 6 weeks postpartum following a spontaneous vaginal delivery. I have fully reviewed the prenatal and intrapartum course. The delivery was at 47 2/7 gestational weeks.  Anesthesia: epidural. Postpartum course has been wnl. Baby's course has been wnl. Baby is feeding by breast feeding only at night, bottle feeding during day Bleeding no bleeding. Bowel function is normal. Bladder function is normal. Patient is sexually active. Contraception method is condoms.   Postpartum depression screening:  Edinburgh Postnatal Depression Scale - 08/22/19 1359      Edinburgh Postnatal Depression Scale:  In the Past 7 Days   I have been able to laugh and see the funny side of things. 0    I have looked forward with enjoyment to things. 0    I have blamed myself unnecessarily when things went wrong. 0    I have been anxious or worried for no good reason. 0    I have felt scared or panicky for no good reason. 0    Things have been getting on top of me. 0    I have been so unhappy that I have had difficulty sleeping. 0    I have felt sad or miserable. 0    I have been so unhappy that I have been crying. 0    The thought of harming myself has occurred to me. 0    Edinburgh Postnatal Depression Scale Total 0             Last pap smear done never   Review of Systems Pertinent items are noted in HPI.    Objective:  BP 121/79   Ht 5' (1.524 m)   Wt 150 lb 3.2 oz (68.1 kg)   LMP 11/05/2018 (Exact Date)   Breastfeeding Yes   BMI 29.33 kg/m   Gen: well appearing, NAD HEENT: no scleral icterus CV: RR Lung: Normal WOB Breast:performed-not indicated  Ext:  warm well perfused  GU: external genitalia wnl Vagina--pink with normal healing and denies sxs vaginitis Rectal: performed -  not indicated       Assessment:    6.2 wk postpartum exam.   Plan:   Essential components of care per ACOG recommendations for Comprehensive Postpartum exam:  1.  Mood and well being: Patient with negative depression screening today. Reviewed local resources for support. EPDS is low risk. Reviewed resources and that mood sx in first year after pregnancy are considered related to pregnancy and to reach out for help at [facility] if needed. Discussed [facility] as link to care and availability of LCSW for counseling  - Patient does not use tobacco. If using tobacco we discussed reduction and for recently cessation risk of relapse - hx of drug use? No    2. Infant care and feeding:  -Patient currently breastmilk feeding? Yes Breastfeeding at night only and bottlefeeding during day. If breastmilk feeding discussed return to work and pumping. If needed, patient was provided letter for work to allow for every 2-3 hr pumping breaks, and to be granted a private location to express breastmilk and refrigerated area to store breastmilk. Reviewed importance of  draining breast regularly to support lactation. I  -Recommended patient engage with WIC/BFpeer counselors  -Counseled to sign new child up for [facility] services -Social determinants of health (SDOH) reviewed in EPIC. No concerns  3. Sexuality, contraception and birth spacing  Contraception: Contraception counseling: Reviewed all forms of birth control options in the tiered based approach. available including abstinence; over the counter/barrier methods; hormonal contraceptive medication including pill, patch, ring, injection,contraceptive implant; hormonal and nonhormonal IUDs; permanent sterilization options including vasectomy and the various tubal sterilization modalities. Risks, benefits, and typical effectiveness rates were reviewed.   Questions were answered.  Written information was also given to the patient to review.  Patient desires Nexplanon, this was not prescribed for patient. She will follow up in  1-2 wks for surveillance.  She was told to call with any further questions, or with any concerns about this method of contraception.  Emphasized use of condoms 100% of the time for STI prevention.  Patient was offered ECP. ECP was not accepted by the patient. ECP counseling was not given - see RN documentation  - Patient does not want a pregnancy in the next year.  Desired family size is 1 children.  - Reviewed forms of contraception in tiered fashion. Patient desired nexplanon today.   - Discussed birth spacing of 18 months  4. Sleep and fatigue -Encouraged family/partner/community support of 4 hrs of uninterrupted sleep to help with mood and fatigue  5. Physical Recovery  - Discussed patients delivery and complications - Patient had a first degree laceration, perineal healing reviewed. Patient expressed understanding - Patient has urinary incontinence? No - Patient is safe to resume physical and sexual activity  6.  Health Maintenance/Chronic Disease - Last pap smear performed never    1. Postpartum exam Bloodwork to begin Rifampin for Tb tx Wants Nexplanon insertion appt--please schedule ASAP - Hemoglobin, venipuncture - CBC with Differential/Platelet - Hepatic function panel   Patient given handout about PCP care in the community Given MVI per family planning program guidelines and availability  Follow up in: 1 week or as needed.

## 2019-08-22 NOTE — Progress Notes (Signed)
Hgb 11.7. Patient requested and was given PNV. Nexplanon consult completed and scheduled Nexplanon Insertion appt for 08/29/19 at 1:40. Patient instructed to abstain from sex until after above appt completed. Instructed to arrive at 1:20 for check in. Tawny Hopping, RN

## 2019-08-22 NOTE — Progress Notes (Addendum)
Here today for PP Exam. Patient delivered NSVD female infant 07/06/2019 (6.5 weeks) @ [facility]. Interested in Nexplanon for PP BCM. Declines all STD screening. Tawny Hopping, RN

## 2019-08-23 LAB — CBC WITH DIFFERENTIAL/PLATELET
Basophils Absolute: 0 10*3/uL (ref 0.0–0.2)
Basos: 0 %
EOS (ABSOLUTE): 0.1 10*3/uL (ref 0.0–0.4)
Eos: 2 %
Hematocrit: 34.7 % (ref 34.0–46.6)
Hemoglobin: 11.7 g/dL (ref 11.1–15.9)
Immature Grans (Abs): 0 10*3/uL (ref 0.0–0.1)
Immature Granulocytes: 0 %
Lymphocytes Absolute: 2 10*3/uL (ref 0.7–3.1)
Lymphs: 29 %
MCH: 28.1 pg (ref 26.6–33.0)
MCHC: 33.7 g/dL (ref 31.5–35.7)
MCV: 83 fL (ref 79–97)
Monocytes Absolute: 0.6 10*3/uL (ref 0.1–0.9)
Monocytes: 8 %
Neutrophils Absolute: 4.3 10*3/uL (ref 1.4–7.0)
Neutrophils: 61 %
Platelets: 273 10*3/uL (ref 150–450)
RBC: 4.16 x10E6/uL (ref 3.77–5.28)
RDW: 12.4 % (ref 11.7–15.4)
WBC: 7.1 10*3/uL (ref 3.4–10.8)

## 2019-08-23 LAB — HEPATIC FUNCTION PANEL
ALT: 63 IU/L — ABNORMAL HIGH (ref 0–32)
AST: 32 IU/L (ref 0–40)
Albumin: 4.4 g/dL (ref 3.9–5.0)
Alkaline Phosphatase: 168 IU/L — ABNORMAL HIGH (ref 45–106)
Bilirubin Total: 0.4 mg/dL (ref 0.0–1.2)
Bilirubin, Direct: 0.14 mg/dL (ref 0.00–0.40)
Total Protein: 7.3 g/dL (ref 6.0–8.5)

## 2019-08-24 ENCOUNTER — Ambulatory Visit: Payer: Self-pay

## 2019-08-29 ENCOUNTER — Other Ambulatory Visit: Payer: Self-pay

## 2019-08-29 ENCOUNTER — Ambulatory Visit (LOCAL_COMMUNITY_HEALTH_CENTER): Payer: Self-pay | Admitting: Family Medicine

## 2019-08-29 VITALS — BP 126/81 | Ht 60.0 in | Wt 154.0 lb

## 2019-08-29 DIAGNOSIS — Z3200 Encounter for pregnancy test, result unknown: Secondary | ICD-10-CM

## 2019-08-29 DIAGNOSIS — Z30017 Encounter for initial prescription of implantable subdermal contraceptive: Secondary | ICD-10-CM

## 2019-08-29 DIAGNOSIS — Z3009 Encounter for other general counseling and advice on contraception: Secondary | ICD-10-CM

## 2019-08-29 LAB — PREGNANCY, URINE: Preg Test, Ur: NEGATIVE

## 2019-08-29 MED ORDER — ETONOGESTREL 68 MG ~~LOC~~ IMPL
68.0000 mg | DRUG_IMPLANT | Freq: Once | SUBCUTANEOUS | Status: AC
  Administered 2019-08-29: 68 mg via SUBCUTANEOUS

## 2019-08-29 NOTE — Progress Notes (Signed)
Pt to clinic for Nexplanon insertion. Last sex was 08/21/19 with condom; no problems with condom. Nexplanon checklist completed 08/22/19 reviewed. Pt sent to lab for UPT. UPT negative.

## 2019-08-29 NOTE — Progress Notes (Signed)
[facility] DEPARTMENT  S: Reviewed with patient and has not had unprotected sex sin 6/14 appt. Desires nexplanon. Does not desire pregnancy in the next year.   O: BP 126/81   Ht 5' (1.524 m) Comment: 5 foot and half inch  Wt 154 lb (69.9 kg)   Breastfeeding Yes Comment: some breastfeeding, not exclusive  BMI 30.08 kg/m    Nexplanon Insertion Procedure Patient identified, informed consent performed, consent signed.   Patient does understand that irregular bleeding is a very common side effect of this medication. She was advised to have backup contraception after placement. Patient was determined to meet WHO criteria for not being pregnant. Appropriate time out taken.  The insertion site was identified 8-10 cm (3-4 inches) from the medial epicondyle of the humerus and 3-5 cm (1.25-2 inches) posterior to (below) the sulcus (groove) between the biceps and triceps muscles of the patient's left arm and marked.  Patient was prepped with alcohol swab and then injected with 3 ml of 1% lidocaine.  Arm was prepped with chlorhexidene, Nexplanon removed from packaging,  Device confirmed in needle, then inserted full length of needle and withdrawn per handbook instructions. Nexplanon was able to palpated in the patient's arm; patient palpated the insert herself. There was minimal blood loss.  Patient insertion site covered with guaze and a pressure bandage to reduce any bruising.  The patient tolerated the procedure well and was given post procedure instructions.   A/P: 1. Possible pregnancy, not yet confirmed - Pregnancy, urine  2. Family planning services - etonogestrel (NEXPLANON) implant 68 mg  3. Nexplanon insertion Inserted easily - etonogestrel (NEXPLANON) implant 68 mg  4. Encounter for other general counseling or advice on contraception Reviewed options and confirmed desire for nexplanon today Reviewed use of condoms for 2 weeks for back up Discussed common SE of device and  available appts here at agency for bleeding management if she has this side effect.  Reviewed device in light of current breastmilk feeding.

## 2019-09-13 ENCOUNTER — Telehealth: Payer: Self-pay

## 2019-09-13 DIAGNOSIS — Z227 Latent tuberculosis: Secondary | ICD-10-CM

## 2019-09-29 ENCOUNTER — Other Ambulatory Visit: Payer: Self-pay | Admitting: Family Medicine

## 2019-09-29 DIAGNOSIS — Z227 Latent tuberculosis: Secondary | ICD-10-CM

## 2019-09-29 HISTORY — DX: Latent tuberculosis: Z22.7

## 2019-09-29 NOTE — Telephone Encounter (Signed)
TC with patient.  Discussed LTBI tx and patient would like to proceed at this time.  TB RN will obtain Rifampin orders from [facility].  Appt scheduled for 09/29/19 Richmond Campbell, RN   EPI updated and orders sent for signature.

## 2019-09-29 NOTE — Progress Notes (Signed)
Tuberculosis treatment orders  All patients are to be monitored per [place] and county TB policies.   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 Treat for latent TB per the following:  Rifampin 600mg  daily by mouth x 4 months, draw LFTs monthly per Dr. .   In addition to orders above, please draw CBC w/platelets and LFTs at LTBI start visit.  Last baseline labs >30 days. Last HIV 05/11/2019- repeat HIV not indicated.    See attached EPI updated via phone. QFT 01/24/2019; CXR 02/02/2019. No Menses since delivery.  Has Nexplanon.  Wt 154lbs.

## 2019-09-29 NOTE — Progress Notes (Signed)
See progress note for patient on same date. Approve LTBI treatment with recommendations for labs per RN documentation.    Federico Flake, MD, MPH, ABFM Medical Director  [facility] Department

## 2019-09-30 ENCOUNTER — Other Ambulatory Visit: Payer: Self-pay

## 2019-09-30 ENCOUNTER — Ambulatory Visit (LOCAL_COMMUNITY_HEALTH_CENTER): Payer: Self-pay

## 2019-09-30 VITALS — Wt 152.0 lb

## 2019-09-30 DIAGNOSIS — R7612 Nonspecific reaction to cell mediated immunity measurement of gamma interferon antigen response without active tuberculosis: Secondary | ICD-10-CM

## 2019-09-30 MED ORDER — RIFAMPIN 300 MG PO CAPS
600.0000 mg | ORAL_CAPSULE | Freq: Every day | ORAL | 0 refills | Status: AC
Start: 2019-09-30 — End: 2019-10-30

## 2019-09-30 NOTE — Progress Notes (Signed)
Rifampin 300mg  #60 dispensed per Dr. 09/29/19 order. 10/01/19, RN   Patient reports feeling hot and cold occasionally.  Provided PCP list. Richmond Campbell, RN

## 2019-10-28 ENCOUNTER — Ambulatory Visit (LOCAL_COMMUNITY_HEALTH_CENTER): Payer: Self-pay

## 2019-10-28 ENCOUNTER — Other Ambulatory Visit: Payer: Self-pay

## 2019-10-28 VITALS — Wt 156.0 lb

## 2019-10-28 DIAGNOSIS — R7612 Nonspecific reaction to cell mediated immunity measurement of gamma interferon antigen response without active tuberculosis: Secondary | ICD-10-CM

## 2019-10-28 MED ORDER — RIFAMPIN 300 MG PO CAPS: 600.0000 mg | ORAL_CAPSULE | Freq: Every day | ORAL | 0 refills | Status: AC

## 2019-10-28 NOTE — Progress Notes (Signed)
Client reports has 10 capsules left in bottle at home. Encouraged to take QD and not miss any doses if at all possible. Client correctly verbalizes how to take medicine (2 capsules together at same time), and measures to take should side effects occur. Per client, still has drug info sheet at home with potential side effects. To lab for hepatic function panel and next appt scheduled. Jossie Ng, RN

## 2019-10-29 LAB — HEPATIC FUNCTION PANEL
ALT: 37 IU/L — ABNORMAL HIGH (ref 0–32)
AST: 21 IU/L (ref 0–40)
Albumin: 4.5 g/dL (ref 3.9–5.0)
Alkaline Phosphatase: 133 IU/L — ABNORMAL HIGH (ref 45–106)
Bilirubin Total: <0.2 mg/dL (ref 0.0–1.2)
Bilirubin, Direct: 0.07 mg/dL (ref 0.00–0.40)
Total Protein: 7.3 g/dL (ref 6.0–8.5)

## 2019-11-25 ENCOUNTER — Other Ambulatory Visit: Payer: Self-pay

## 2019-11-25 ENCOUNTER — Ambulatory Visit (LOCAL_COMMUNITY_HEALTH_CENTER): Payer: Self-pay

## 2019-11-25 VITALS — Ht 60.0 in | Wt 153.0 lb

## 2019-11-25 DIAGNOSIS — R7612 Nonspecific reaction to cell mediated immunity measurement of gamma interferon antigen response without active tuberculosis: Secondary | ICD-10-CM

## 2019-11-25 MED ORDER — RIFAMPIN 300 MG PO CAPS
600.0000 mg | ORAL_CAPSULE | Freq: Every day | ORAL | 0 refills | Status: AC
Start: 2019-11-25 — End: 2019-12-25

## 2019-11-26 LAB — HEPATIC FUNCTION PANEL
ALT: 442 IU/L — ABNORMAL HIGH (ref 0–32)
AST: 514 IU/L (ref 0–40)
Albumin: 4.5 g/dL (ref 3.9–5.0)
Alkaline Phosphatase: 178 IU/L — ABNORMAL HIGH (ref 42–106)
Bilirubin Total: 1.1 mg/dL (ref 0.0–1.2)
Bilirubin, Direct: 0.93 mg/dL — ABNORMAL HIGH (ref 0.00–0.40)
Total Protein: 7.4 g/dL (ref 6.0–8.5)

## 2019-11-28 ENCOUNTER — Telehealth: Payer: Self-pay

## 2019-11-28 ENCOUNTER — Other Ambulatory Visit (LOCAL_COMMUNITY_HEALTH_CENTER): Payer: Self-pay

## 2019-11-28 ENCOUNTER — Other Ambulatory Visit: Payer: Self-pay

## 2019-11-28 DIAGNOSIS — R7989 Other specified abnormal findings of blood chemistry: Secondary | ICD-10-CM

## 2019-11-28 NOTE — Progress Notes (Signed)
Patient in for Hepatitis labs and covid testing today.  CO RN will call with results and instructed to not return to work until further notice. Richmond Campbell, RN

## 2019-11-28 NOTE — Telephone Encounter (Signed)
TC with patient after reviewing patient's recent LFTs from 11/25/19 LTBI/Rifampin visit. Patient denies jaundice, unusual bruising/bleeding or flu like sx's.  Patient did report that she ate something on 11/24/19 and had abd and was very sick.  Instructed patient to stop Rifampin and do not take anything until instructed to do so.  TB RN will consult with Dr. Sherryll Burger, provider on call. Richmond Campbell, RN    Consult with Dr. Sherryll Burger. Informed of the above. Per Dr. Sherryll Burger please order Hepatitis panel, flu and covid PCR and abd u/s if able.    TC back to patient. Informed of the above.  Patient states she vomiting started Thursday afternoon after eating a quesidilla on break at her place of employment ([employer]). Now patient reports that she had vomiting since Thursday but felt much better as of yesterday.  Instructed patient to come in this am for blood work and other testing.  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  TB RN informed patient that a work note will be provided and stressed importance re: timely evaluation. Reiterated no meds, no vitamins/supplements, no rifampin and no ETOH.  Informed patient that this is very serious.  Richmond Campbell, RN

## 2019-11-28 NOTE — Progress Notes (Signed)
Rifampin 300mg  #60 dispensed per Dr. order. Alvester Morin, RN

## 2019-11-29 ENCOUNTER — Telehealth: Payer: Self-pay

## 2019-11-29 LAB — CBC WITH DIFFERENTIAL/PLATELET
Basophils Absolute: 0.1 10*3/uL (ref 0.0–0.2)
Basos: 1 %
EOS (ABSOLUTE): 0.2 10*3/uL (ref 0.0–0.4)
Eos: 2 %
Hematocrit: 37.5 % (ref 34.0–46.6)
Hemoglobin: 12.7 g/dL (ref 11.1–15.9)
Immature Grans (Abs): 0 10*3/uL (ref 0.0–0.1)
Immature Granulocytes: 0 %
Lymphocytes Absolute: 2.1 10*3/uL (ref 0.7–3.1)
Lymphs: 29 %
MCH: 28.5 pg (ref 26.6–33.0)
MCHC: 33.9 g/dL (ref 31.5–35.7)
MCV: 84 fL (ref 79–97)
Monocytes Absolute: 0.6 10*3/uL (ref 0.1–0.9)
Monocytes: 8 %
Neutrophils Absolute: 4.5 10*3/uL (ref 1.4–7.0)
Neutrophils: 60 %
Platelets: 262 10*3/uL (ref 150–450)
RBC: 4.46 x10E6/uL (ref 3.77–5.28)
RDW: 11.9 % (ref 11.7–15.4)
WBC: 7.4 10*3/uL (ref 3.4–10.8)

## 2019-11-29 LAB — HEPATITIS B SURFACE ANTIGEN: Hepatitis B Surface Ag: NEGATIVE

## 2019-11-29 LAB — HEPATIC FUNCTION PANEL
ALT: 413 IU/L — ABNORMAL HIGH (ref 0–32)
AST: 86 IU/L — ABNORMAL HIGH (ref 0–40)
Albumin: 4.4 g/dL (ref 3.9–5.0)
Alkaline Phosphatase: 255 IU/L — ABNORMAL HIGH (ref 42–106)
Bilirubin Total: 0.5 mg/dL (ref 0.0–1.2)
Bilirubin, Direct: 0.27 mg/dL (ref 0.00–0.40)
Total Protein: 7.4 g/dL (ref 6.0–8.5)

## 2019-11-29 LAB — HCV AB W/RFLX TO VERIFICATION: HCV Ab: <0.1 s/co ratio (ref 0.0–0.9)

## 2019-11-29 LAB — HEPATITIS A ANTIBODY, IGM: Hep A IgM: NEGATIVE

## 2019-11-29 LAB — HEPATITIS B CORE ANTIBODY, TOTAL: Hep B Core Total Ab: NEGATIVE

## 2019-11-29 LAB — HEPATITIS B CORE ANTIBODY, IGM: Hep B C IgM: NEGATIVE

## 2019-11-29 LAB — HCV INTERPRETATION

## 2019-11-29 LAB — HEPATITIS B SURFACE ANTIBODY, QUANTITATIVE: Hepatitis B Surf Ab Quant: <3.1 m[IU]/mL — ABNORMAL LOW (ref >9.9)

## 2019-11-29 NOTE — Telephone Encounter (Signed)
Left VM x2 for patient. Referral placed for [facility] liver care doctor. Awaiting confirmation of next available appointment.

## 2019-11-29 NOTE — Telephone Encounter (Signed)
Left VM for patient regarding labs from 11/28/2019

## 2019-11-30 ENCOUNTER — Telehealth: Payer: Self-pay

## 2019-11-30 NOTE — Telephone Encounter (Signed)
Left third VM for patient to provide information about lab results.    Appt scheduled with liver care provider at [facility] for 12/28/2019 at 1:20 PM.

## 2019-12-09 ENCOUNTER — Other Ambulatory Visit (LOCAL_COMMUNITY_HEALTH_CENTER): Payer: Self-pay

## 2019-12-09 DIAGNOSIS — R7612 Nonspecific reaction to cell mediated immunity measurement of gamma interferon antigen response without active tuberculosis: Secondary | ICD-10-CM

## 2019-12-10 LAB — HEPATIC FUNCTION PANEL
ALT: 28 IU/L (ref 0–32)
AST: 17 IU/L (ref 0–40)
Albumin: 4.4 g/dL (ref 3.9–5.0)
Alkaline Phosphatase: 139 IU/L — ABNORMAL HIGH (ref 42–106)
Bilirubin Total: 0.3 mg/dL (ref 0.0–1.2)
Bilirubin, Direct: 0.12 mg/dL (ref 0.00–0.40)
Total Protein: 6.9 g/dL (ref 6.0–8.5)

## 2019-12-11 NOTE — Progress Notes (Signed)
Patient in clinic today for f/u LFTs.  Hx of elevated LFTs.  Currently is not taking Rifampin until f/u with PCP Richmond Campbell, RN

## 2020-01-26 ENCOUNTER — Other Ambulatory Visit (LOCAL_COMMUNITY_HEALTH_CENTER): Payer: Self-pay

## 2020-01-26 DIAGNOSIS — R7612 Nonspecific reaction to cell mediated immunity measurement of gamma interferon antigen response without active tuberculosis: Secondary | ICD-10-CM

## 2020-01-26 NOTE — Progress Notes (Signed)
Per Dr. Alvester Morin LFTs drawn today before patient restarts Rifampin.  Patient had elevated LFTs in September. TB RN will f/u with patient once results are complete. Richmond Campbell, RN

## 2020-01-27 LAB — HEPATIC FUNCTION PANEL
ALT: 12 IU/L (ref 0–32)
AST: 13 IU/L (ref 0–40)
Albumin: 4.4 g/dL (ref 3.9–5.0)
Alkaline Phosphatase: 112 IU/L — ABNORMAL HIGH (ref 42–106)
Bilirubin Total: 0.5 mg/dL (ref 0.0–1.2)
Bilirubin, Direct: 0.14 mg/dL (ref 0.00–0.40)
Total Protein: 7.4 g/dL (ref 6.0–8.5)

## 2020-04-09 ENCOUNTER — Other Ambulatory Visit: Payer: Self-pay

## 2020-04-09 ENCOUNTER — Telehealth: Payer: Self-pay

## 2020-04-09 DIAGNOSIS — Z227 Latent tuberculosis: Secondary | ICD-10-CM

## 2020-04-09 NOTE — Progress Notes (Signed)
Event organiser for TB RN: I was consulted and documentation reflects my recommendations.   Federico Flake, MD, MPH, ABFM [facility] Medical Director

## 2020-04-09 NOTE — Progress Notes (Signed)
Tuberculosis treatment orders  All patients are to be monitored per  and county TB policies.   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 Treat for latent TB per the following:  Rifampin 600mg  daily by mouth x 4 months total within 6 months, draw LFTs monthly per Dr. .  Patient restarted Rifampin 01/31/2020 after discontinuing Rifampin d/t elevated LFTs unrelated to LTBI tx and Rifampin.  Will need 3 months of Rifampin in addition to what patient has already taken.  Appended and updated EPI from 02/01/2019; no changes.   + QFT 01/24/2019; CXR without Active TB on 02/02/2019

## 2020-04-09 NOTE — Telephone Encounter (Signed)
TC from patient this am.  Started Rifampin back on 01/31/2020 and wants to finish up LTBI. Stressed importance of completing 4 months within 6 months.   Patient had elevated LFTs 2021 and stopped Rifampin. TB RN will consult with Dr. Alvester Morin and will likely need new orders for patient. Appt scheduled for 04/11/2020 Richmond Campbell, RN

## 2020-04-11 ENCOUNTER — Other Ambulatory Visit: Payer: Self-pay

## 2020-04-11 ENCOUNTER — Ambulatory Visit (LOCAL_COMMUNITY_HEALTH_CENTER): Payer: Self-pay

## 2020-04-11 VITALS — Wt 162.0 lb

## 2020-04-11 DIAGNOSIS — R7612 Nonspecific reaction to cell mediated immunity measurement of gamma interferon antigen response without active tuberculosis: Secondary | ICD-10-CM

## 2020-04-11 MED ORDER — RIFAMPIN 300 MG PO CAPS
600.0000 mg | ORAL_CAPSULE | Freq: Every day | ORAL | 0 refills | Status: AC
Start: 2020-04-11 — End: 2020-05-11

## 2020-04-11 NOTE — Progress Notes (Signed)
Here for LTBI management. Due for Rifampin bottle #2. Reports she is taking last Rifampin today from her current bottle. Admits to missing 2 days worth from current bottle. Voices no concerns. States she is committed to completing course of Rifampin as prescribed.  Rifampin 300mg  #60 dispensed today per order by K. , MD dated 04/09/2020 with instructions to take 2 pills by mouth daily. Treatment consent signed today. Rifampin info sheet, TB Coord contact card given. Questions answered and reports understanding. Next TB med appt scheduled 05/09/2020 at 11 am (arrival 10:45 am), pt aware. Pt escorted to lab for LFT's. 07/09/2020, RN

## 2020-04-12 LAB — HEPATIC FUNCTION PANEL
ALT: 22 IU/L (ref 0–32)
AST: 16 IU/L (ref 0–40)
Albumin: 4.3 g/dL (ref 3.9–5.0)
Alkaline Phosphatase: 127 IU/L — ABNORMAL HIGH (ref 42–106)
Bilirubin Total: 0.2 mg/dL (ref 0.0–1.2)
Bilirubin, Direct: <0.1 mg/dL (ref 0.00–0.40)
Total Protein: 7.4 g/dL (ref 6.0–8.5)

## 2020-05-10 ENCOUNTER — Telehealth: Payer: Self-pay

## 2020-05-10 DIAGNOSIS — Z227 Latent tuberculosis: Secondary | ICD-10-CM

## 2020-05-21 ENCOUNTER — Ambulatory Visit (LOCAL_COMMUNITY_HEALTH_CENTER): Payer: Self-pay

## 2020-05-21 ENCOUNTER — Other Ambulatory Visit: Payer: Self-pay

## 2020-05-21 VITALS — Wt 162.0 lb

## 2020-05-21 DIAGNOSIS — R7612 Nonspecific reaction to cell mediated immunity measurement of gamma interferon antigen response without active tuberculosis: Secondary | ICD-10-CM

## 2020-05-21 MED ORDER — RIFAMPIN 300 MG PO CAPS: 600.0000 mg | ORAL_CAPSULE | Freq: Every day | ORAL | 0 refills | Status: AC

## 2020-05-21 NOTE — Progress Notes (Signed)
LTBI TB med management. Due for Rifampin #3 bottle today. Pt reports she has missed 2 days worth of Rifampin during this past month and ran out of Rifampin yesterday. Voices no concerns today. Rifampin info sheet given and advised to call [facility] with any concerns. Rifampin 300 mg #60 dispensed today per order by K. Alvester Morin, MD dated 04/09/2020 with instructions for pt to take 2 pills by mouth daily. Pt escorted to lab for LFT's. Next appt for TB med completion scheduled 06/19/2020 at 10:15 am, arrival 10 am. Pt aware of appt. Jerel Shepherd, RN

## 2020-05-22 LAB — HEPATIC FUNCTION PANEL
ALT: 19 IU/L (ref 0–32)
AST: 14 IU/L (ref 0–40)
Albumin: 4.7 g/dL (ref 3.9–5.0)
Alkaline Phosphatase: 127 IU/L — ABNORMAL HIGH (ref 42–106)
Bilirubin Total: <0.2 mg/dL (ref 0.0–1.2)
Bilirubin, Direct: <0.1 mg/dL (ref 0.00–0.40)
Total Protein: 7.7 g/dL (ref 6.0–8.5)

## 2020-06-11 NOTE — Telephone Encounter (Signed)
Patient scheduled TBM appt Richmond Campbell, RN

## 2020-06-19 ENCOUNTER — Ambulatory Visit (LOCAL_COMMUNITY_HEALTH_CENTER): Payer: Self-pay

## 2020-06-19 ENCOUNTER — Other Ambulatory Visit: Payer: Self-pay

## 2020-06-19 VITALS — Wt 162.0 lb

## 2020-06-19 DIAGNOSIS — R7612 Nonspecific reaction to cell mediated immunity measurement of gamma interferon antigen response without active tuberculosis: Secondary | ICD-10-CM

## 2020-06-19 MED ORDER — RIFAMPIN 300 MG PO CAPS
600.0000 mg | ORAL_CAPSULE | Freq: Every day | ORAL | 0 refills | Status: AC
Start: 2020-06-19 — End: 2020-07-19

## 2020-06-20 LAB — HEPATIC FUNCTION PANEL
ALT: 12 IU/L (ref 0–32)
AST: 11 IU/L (ref 0–40)
Albumin: 4.8 g/dL (ref 3.9–5.0)
Alkaline Phosphatase: 144 IU/L — ABNORMAL HIGH (ref 42–106)
Bilirubin Total: 0.3 mg/dL (ref 0.0–1.2)
Bilirubin, Direct: 0.14 mg/dL (ref 0.00–0.40)
Total Protein: 7.7 g/dL (ref 6.0–8.5)

## 2020-06-25 NOTE — Progress Notes (Signed)
Last bottle of Rifampin disp to patient.  End of tx paperwork provided Richmond Campbell, RN   No PCP

## 2021-05-27 IMAGING — CR DG CHEST 1V
1 series · 1 of 1 positions shown · non-contrast
Comparison: 11/25/2007

CLINICAL DATA: Fourteen weeks pregnant, skin reaction to TB test

EXAM:
CHEST  1 VIEW

[dg chest 1 view]
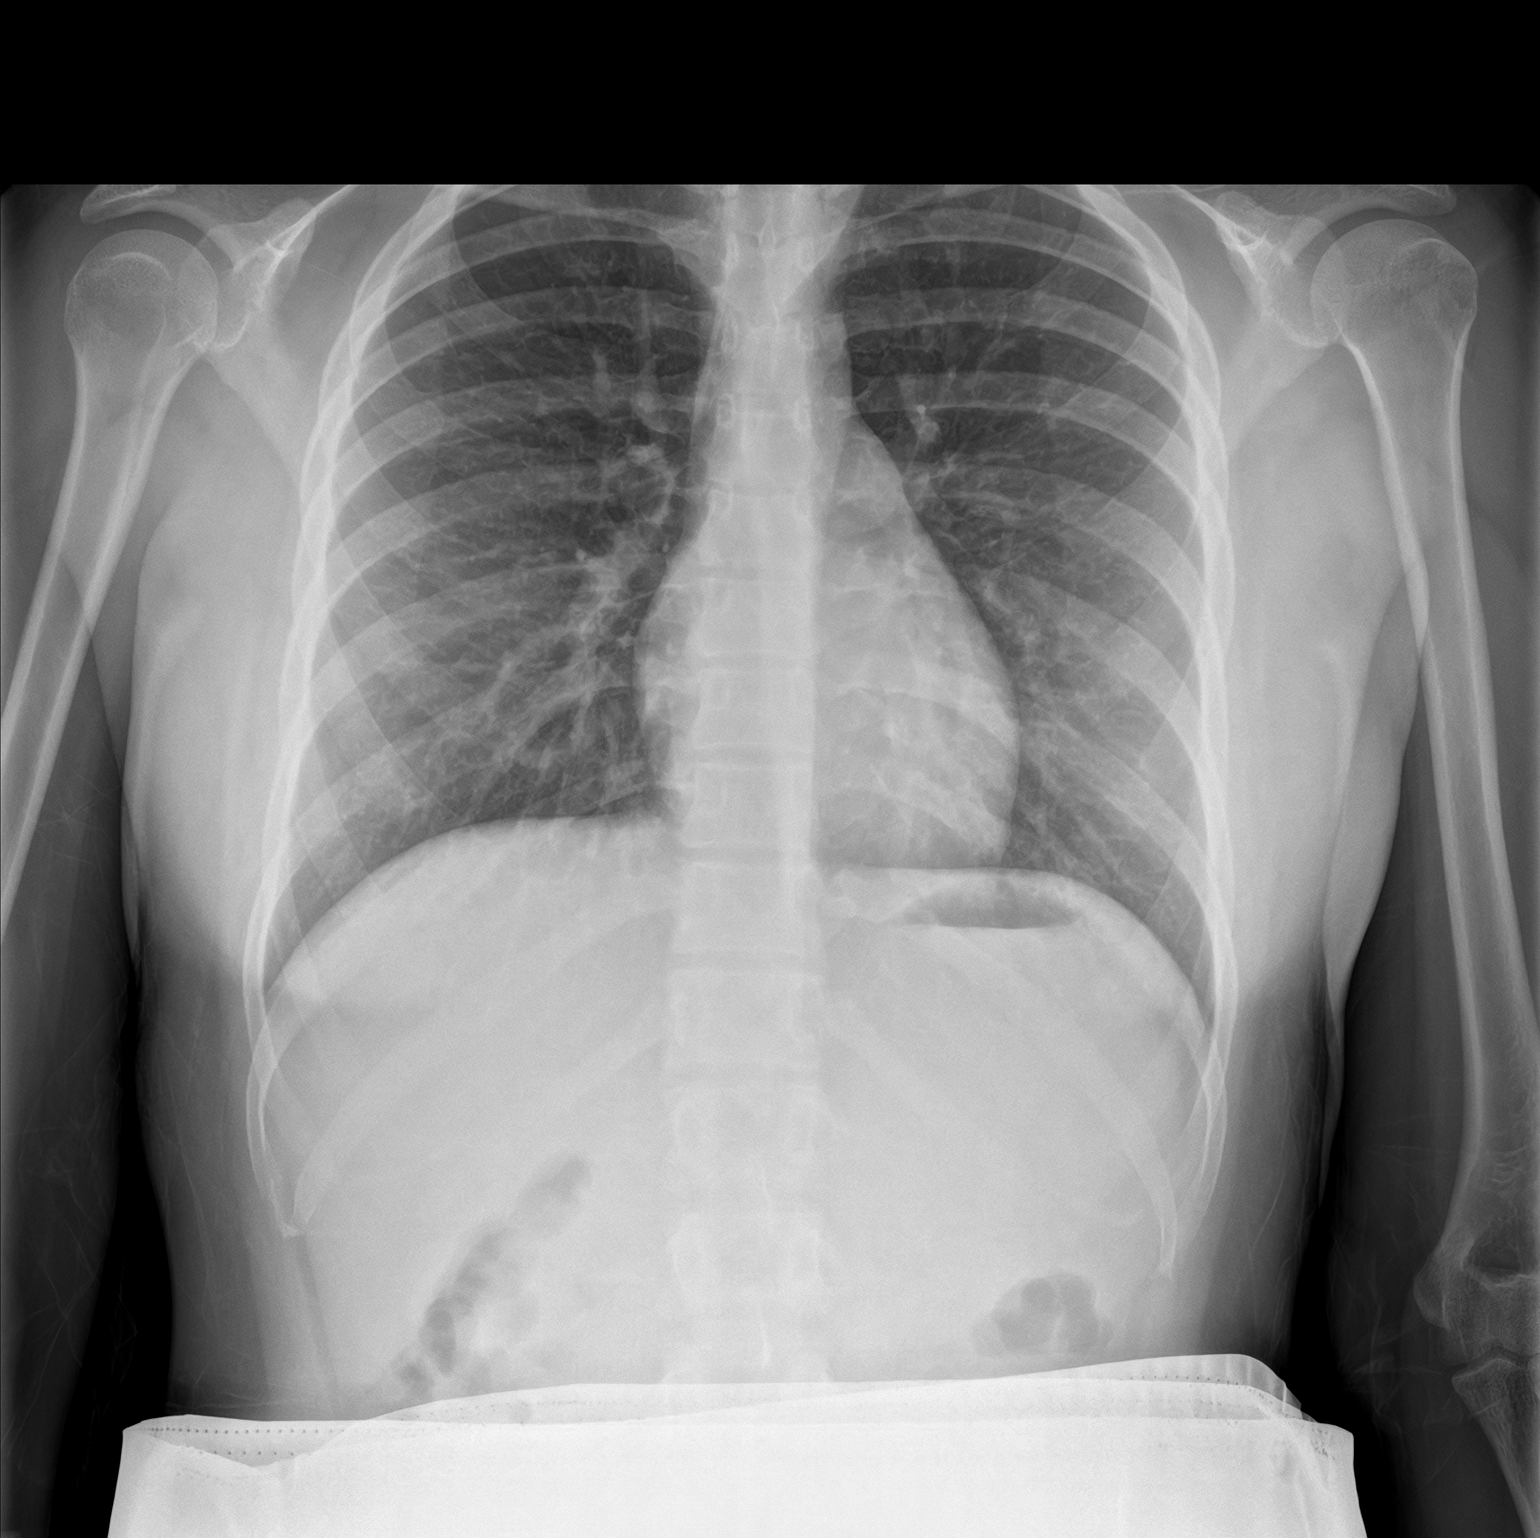

[1 of 1 positions shown; findings below may reference images not displayed]

FINDINGS: Normal heart size, mediastinal contours, and pulmonary vascularity.

Lungs clear.

No pleural effusion or pneumothorax.

Bones unremarkable.
IMPRESSION: Normal exam.

## 2021-07-01 IMAGING — US US OB COMP +14 WK
1 of 2 series · 13 of 28 positions shown · non-contrast
Comparison: none

CLINICAL DATA: 17-year-old pregnant female presents for fetal
anatomic survey.

EXAM:
OBSTETRICAL ULTRASOUND >14 WKS

[Series 1: us ob comp +14 wk · 13 of 76 slices shown]
[im 3/76]
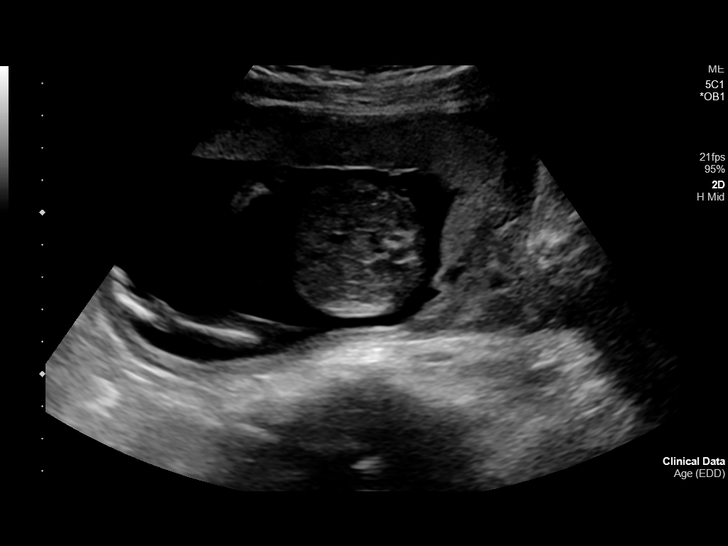
[im 9/76]
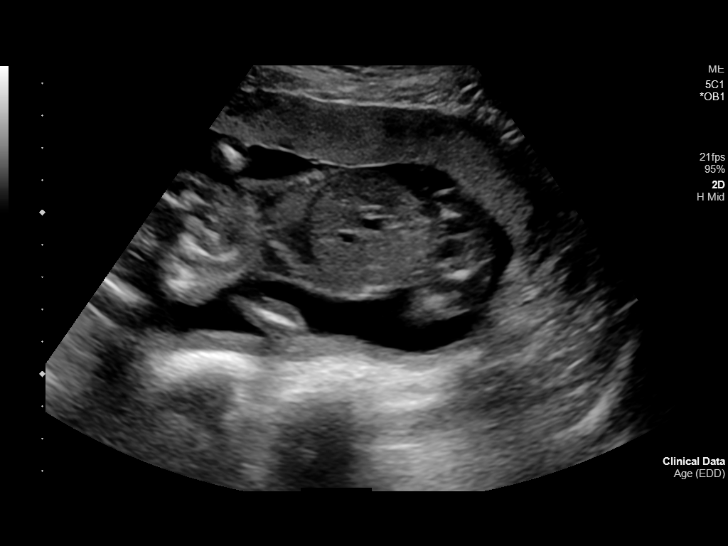
[im 15/76]
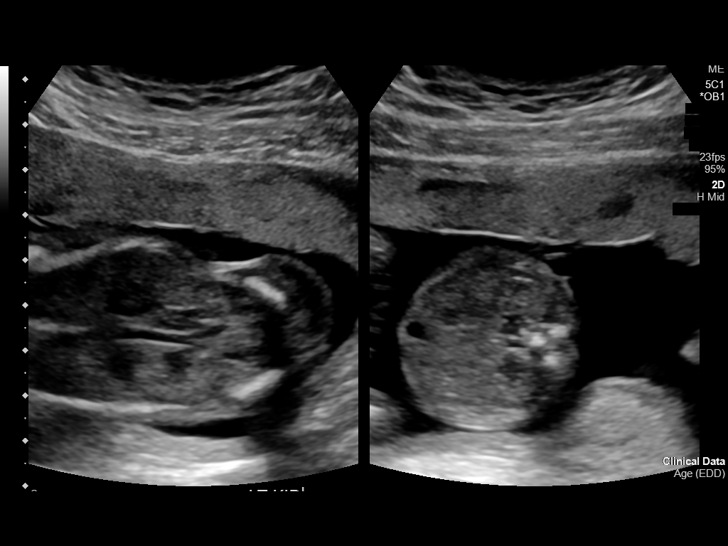
[im 21/76]
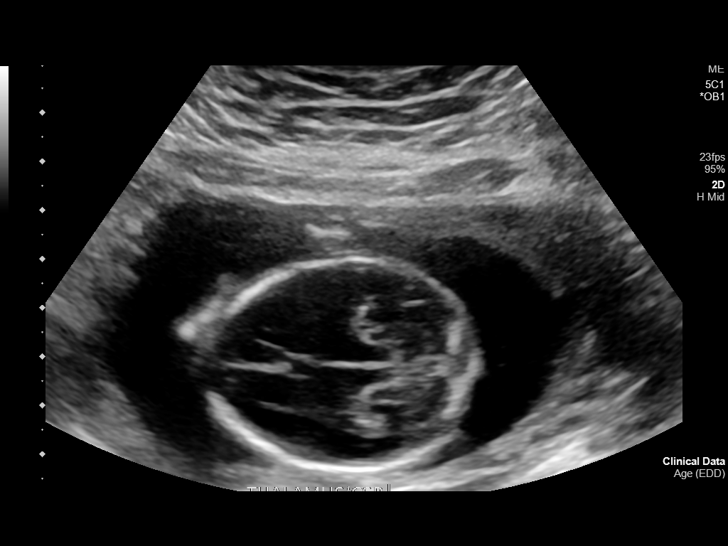
[im 26/76]
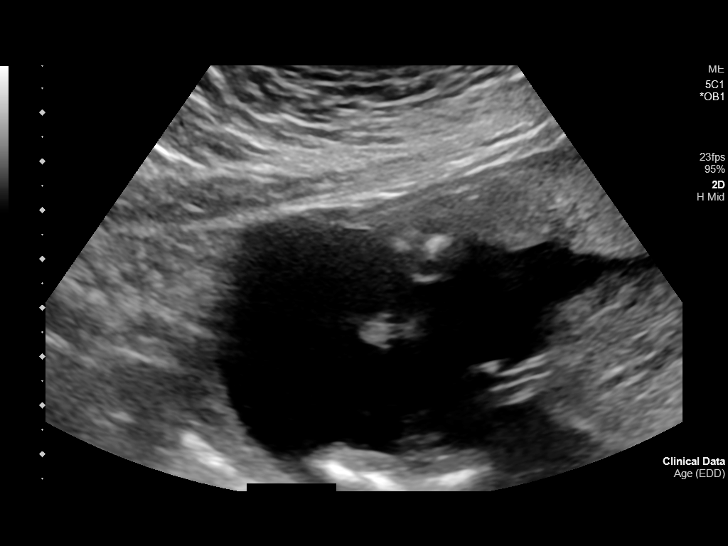
[im 32/76]
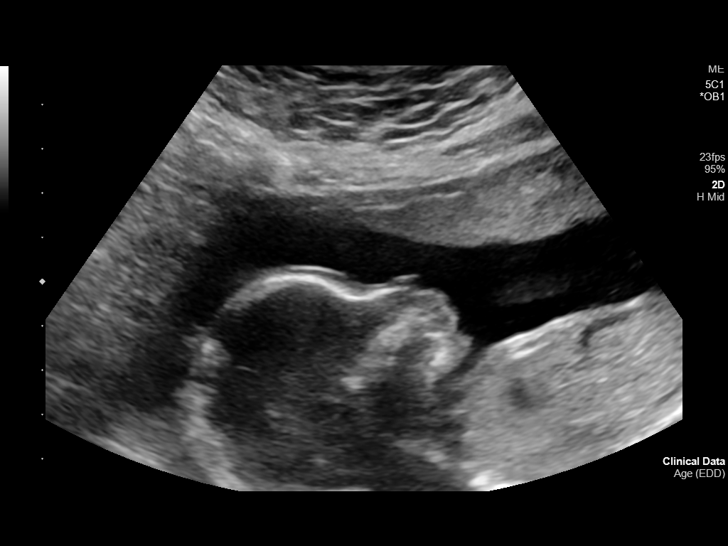
[im 41/76]
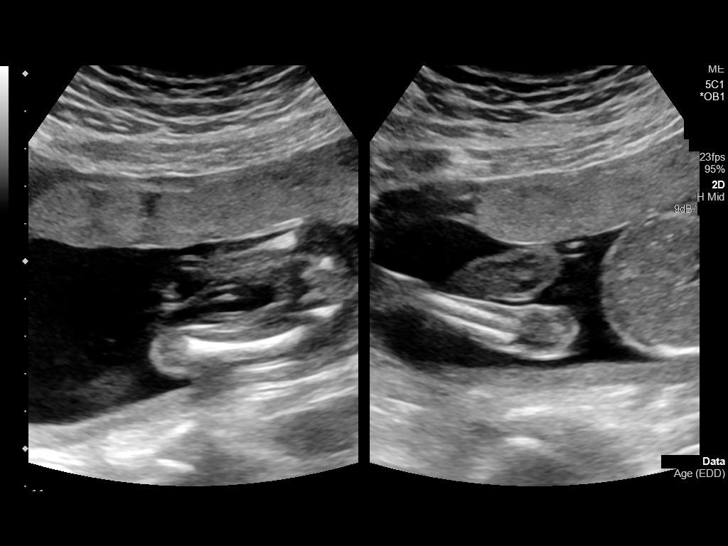
[im 47/76]
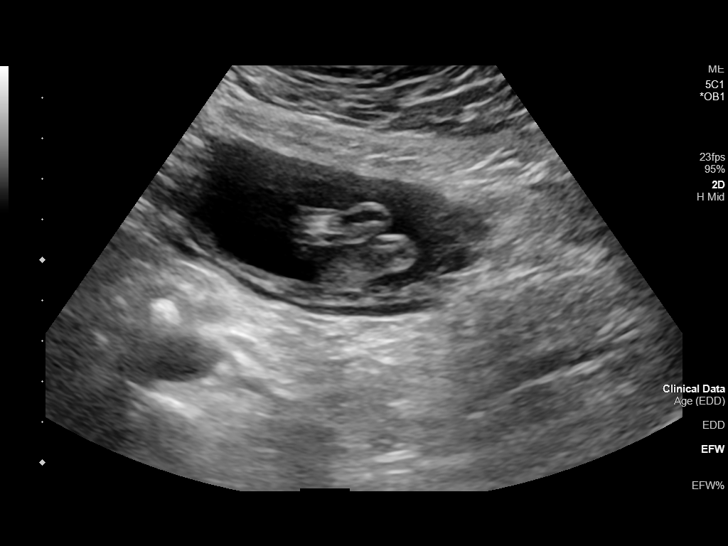
[im 52/76]
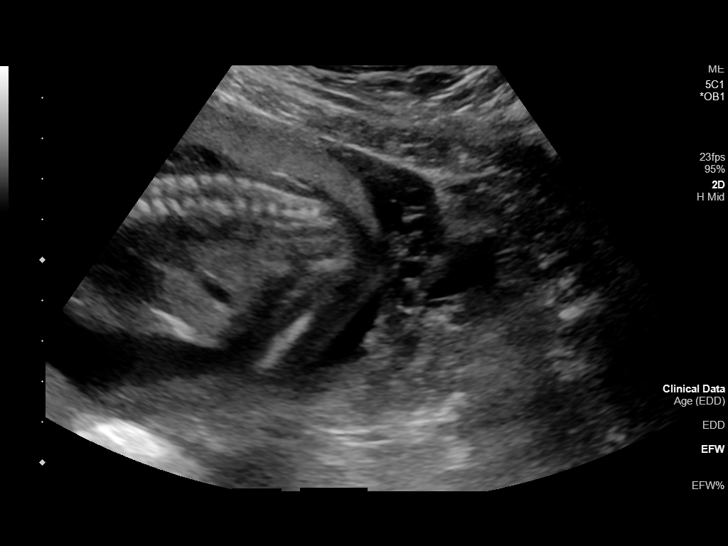
[im 58/76]
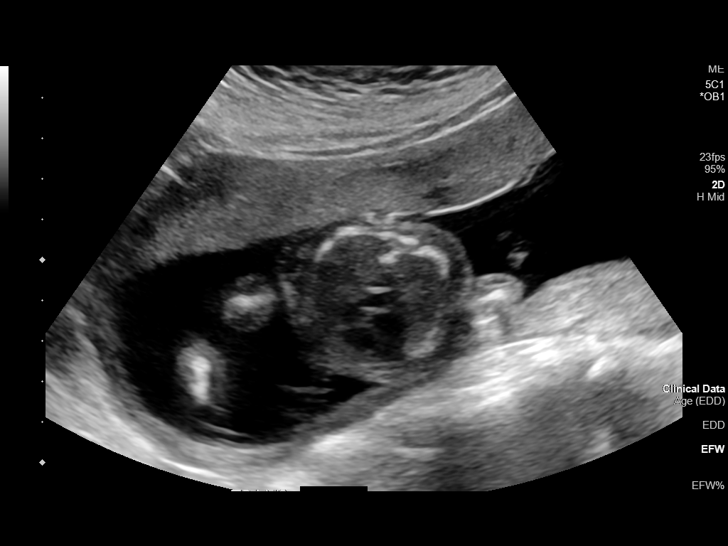
[im 64/76]
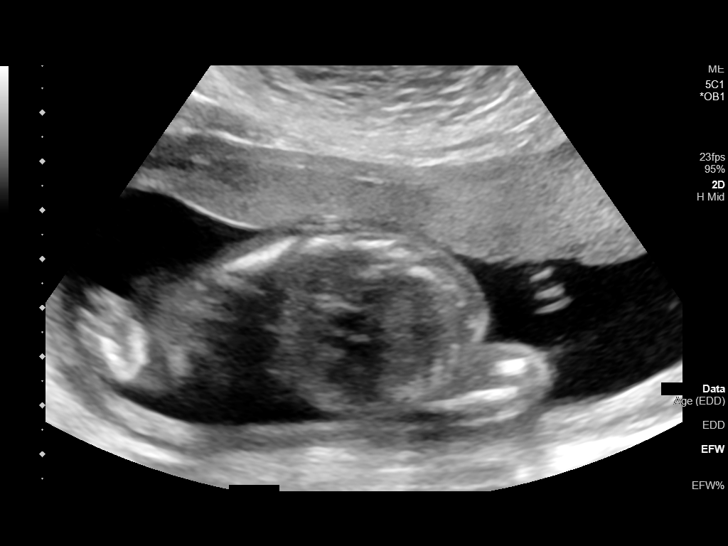
[im 70/76]
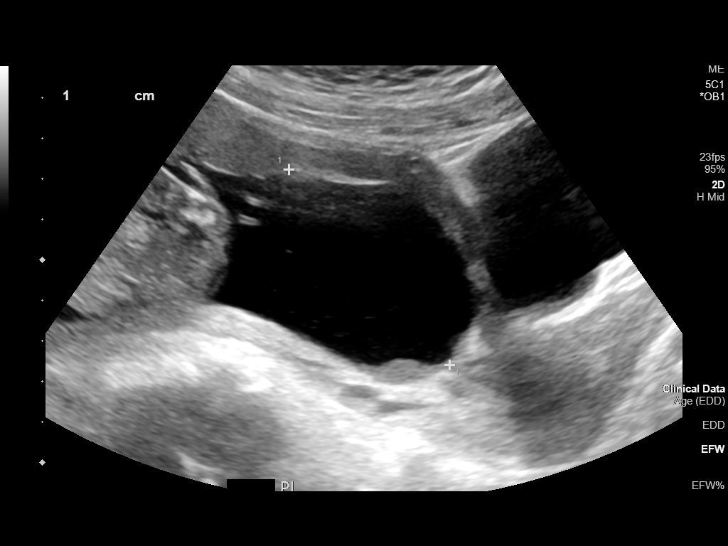
[im 76/76]
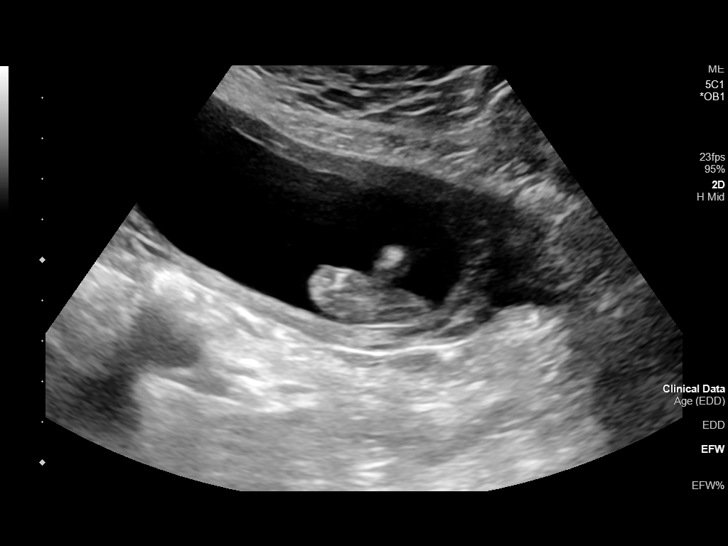

[13 of 28 positions shown; findings below may reference images not displayed]

FINDINGS: Number of Fetuses: 1

Heart Rate:  145 bpm

Movement: Yes

Presentation: Breech

Previa: No

Placental Location: Anterior

Amniotic Fluid (Subjective): Normal

Amniotic Fluid (Objective):

Vertical pocket = 5.9cm

FETAL BIOMETRY

BPD: 4.4cm 19w 1d

HC:   16.3cm 19w 0d

AC:   13.9cm 19w 2d

FL:   3.0cm 19w 3d

Current Mean GA: 19w 1d US EDC: 08/02/2019

Assigned GA:  19w 2d Assigned EDC: 08/01/2019

Estimated Fetal Weight:  286g

FETAL ANATOMY

Lateral Ventricles: Appears normal

Thalami/CSP: Appears normal

Posterior Fossa:  Appears normal

Nuchal Region: Appears normal   NFT= 3.6 mm

Upper Lip: Appears normal

Spine: Appears normal

4 Chamber Heart on Left: Appears normal

LVOT: Appears normal

RVOT: Not visualized

Stomach on Left: Appears normal

3 Vessel Cord: Appears normal

Cord Insertion site: Appears normal

Kidneys: Appears normal

Bladder: Appears normal

Extremities: Appears normal, 4 extremities demonstrated

Technically difficult due to: Fetal position

Maternal Findings:

Cervix: Cervix length approximately 4.0 cm on transabdominal views,
with no evidence of internal cervical funneling.
IMPRESSION: 1. Single living intrauterine gestation in breech lie at 19 weeks 1
day by average ultrasound age, concordant with assigned dating.
2. Incomplete fetal anatomic survey with attention to the fetal
RVOT. No fetal or maternal abnormalities detected. Recommend
follow-up obstetric scan in 4 weeks for completion of fetal anatomic
survey.

## 2021-07-23 ENCOUNTER — Encounter: Payer: Self-pay | Admitting: Nurse Practitioner

## 2021-07-23 ENCOUNTER — Ambulatory Visit (LOCAL_COMMUNITY_HEALTH_CENTER): Payer: Self-pay | Admitting: Nurse Practitioner

## 2021-07-23 ENCOUNTER — Ambulatory Visit: Payer: Self-pay

## 2021-07-23 VITALS — BP 129/77 | Ht 60.0 in | Wt 168.8 lb

## 2021-07-23 DIAGNOSIS — Z3046 Encounter for surveillance of implantable subdermal contraceptive: Secondary | ICD-10-CM

## 2021-07-23 DIAGNOSIS — Z01419 Encounter for gynecological examination (general) (routine) without abnormal findings: Secondary | ICD-10-CM

## 2021-07-23 DIAGNOSIS — Z3009 Encounter for other general counseling and advice on contraception: Secondary | ICD-10-CM

## 2021-07-23 NOTE — Progress Notes (Signed)
[facility] ?[address] ?Main Number: [phone] ? ? ? ?Family Planning Visit- Initial Visit ? ?Subjective:  ?[name] Black is a 21 y.o.  G1P0101   being seen today for an initial annual visit and to discuss reproductive life planning.  The patient is currently using Hormonal Implant for pregnancy prevention. Patient reports   does not want a pregnancy in the next year.   ? ? report they are looking for a method that provides Other patient currently not looking for a particular method at this time.  Desires to only use condoms.   ? ?Patient has the following medical conditions has Intentional drug overdose [facility](HCC) age 21 on mom's rx[facility]; MDD (major depressive disorder), single episode, severe , no psychosis (HCC); PTSD (post-traumatic stress disorder); Depression affecting pregnancy, antepartum; Supervision of normal first pregnancy, antepartum; Nausea and vomiting during pregnancy; UTI (urinary tract infection) during pregnancy 01/24/19 E. 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 ? ?Chief Complaint  ?Patient presents with  ? Contraception  ?  PE, Pap and Nexplanon removal  ? ? ?Patient reports to clinic today for a physical and Nexplanon removal.   ? ?Patient denies signs and symptoms   ? ?Body mass index is 32.97 kg/m?. - Patient is eligible for diabetes screening based on BMI and age 90>40?  not applicable ?HA1C ordered? not applicable ? ?Patient reports 1  partner/s in last year. Desires  STI screening?  No - patient refused  ? ?Has patient been screened once for HCV in the past?  No ? No results found for: HCVAB ? ?Does the patient have current drug use (including MJ), have a partner with drug use, and/or has been incarcerated since last result? No  ?If yes-- Screen for HCV through [facility] ?  ?Does the patient meet criteria for HBV testing? No ? ?Criteria:  ?-Household, sexual or needle sharing contact with HBV ?-History of drug use ?-HIV positive ?-Those with known Hep C ? ? ?Health Maintenance Due  ?Topic Date Due  ? COVID-19 Vaccine (1) Never done  ? CHLAMYDIA SCREENING  07/05/2020  ? PAP-Cervical Cytology Screening  Never done  ? PAP SMEAR-Modifier  Never done  ? ? ?Review of Systems  ?Constitutional:  Negative for chills, fever, malaise/fatigue and weight loss.  ?HENT:  Negative for congestion, hearing loss and sore throat.   ?Eyes:  Negative for blurred vision, double vision and photophobia.  ?Respiratory:  Negative for shortness of breath.   ?Cardiovascular:  Negative for chest pain.  ?Gastrointestinal:  Negative for abdominal pain, blood in stool, constipation, diarrhea, heartburn, nausea and vomiting.  ?Genitourinary:  Negative for dysuria and frequency.  ?Musculoskeletal:  Negative for back pain, joint pain and neck pain.  ?Skin:  Negative for itching and rash.  ?Neurological:  Negative for dizziness, weakness and headaches.  ?Endo/Heme/Allergies:  Does not bruise/bleed easily.  ?Psychiatric/Behavioral:  Negative for depression, substance abuse and suicidal ideas.   ? ?The following portions of the patient's history were reviewed and updated as appropriate: allergies, current medications, past family history, past medical history, past social history, past surgical  history and problem list. Problem list updated. ? ? ?See flowsheet for other program required questions. ? ?Objective:  ? ?Vitals:  ? 07/23/21 1514  ?BP: 129/77  ?Weight: 168 lb 12.8 oz (76.6 kg)  ?Height: 5' (1.524 m)   ? ? ?Physical Exam ?Constitutional:   ?   Appearance: Normal appearance.  ?HENT:  ?   Head: Normocephalic.  ?   Right Ear: External ear normal.  ?   Left Ear: External ear normal.  ?   Nose: Nose normal.  ?   Mouth/Throat:  ?   Lips: Pink.  ?   Mouth: Mucous membranes are moist.  ?   Comments: No visible signs of dental caries  ?Eyes:  ?   Pupils: Pupils are equal, round, and reactive to light.  ?Cardiovascular:  ?   Rate and Rhythm: Normal rate and regular rhythm.  ?Pulmonary:  ?   Effort: Pulmonary effort is normal.  ?   Breath sounds: Normal breath sounds.  ?Chest:  ?   Comments: Breasts:  ?      Right: Normal. No swelling, mass, nipple discharge, skin change or tenderness.  ?      Left: Normal. No swelling, mass, nipple discharge, skin change or tenderness.   ?Abdominal:  ?   General: Abdomen is flat. Bowel sounds are normal.  ?   Palpations: Abdomen is soft.  ?Genitourinary: ?   Comments: External genitalia/pubic area without nits, lice, edema, erythema, lesions and inguinal adenopathy. ?Vagina with normal mucosa and discharge. ?Cervix without visible lesions. ?Uterus firm, mobile, nt, no masses, no CMT, no adnexal tenderness or fullness. pH 4.5. ?Musculoskeletal:  ?   Cervical back: Full passive range of motion without pain, normal range of motion and neck supple.  ?Skin: ?   General: Skin is warm and dry.  ?Neurological:  ?   Mental Status: She is alert and oriented to person, place, and time.  ?Psychiatric:     ?   Attention and Perception: Attention normal.     ?   Mood and Affect: Mood normal.     ?   Speech: Speech normal.     ?   Behavior: Behavior normal. Behavior is cooperative.  ? ? ? ? ?Assessment and Plan:  ?[name] Black is a 21 y.o. female presenting to the [facility] Department for an initial annual wellness/contraceptive visit ? ?Contraception counseling: Reviewed options based on patient desire and reproductive life plan. Patient is interested in No Method - Other Reason. This  was provided to the patient today.   ? ?Risks, benefits, and typical effectiveness rates were reviewed.  Questions were answered.  Written information was also given to the patient to review.   ? ?The patient will follow up in  1 years for surveillance.  The patient was told to call with any further questions, or with any concerns about this method of contraception.  Emphasized use of condoms 100% of the time for STI prevention. ? ?Need for ECP was assessed. ECP not provided today due to continuous use of contraception. ? ? ?1. Family planning services ?-21 year old female in clinic today for a physical and Nexplanon removal. ?-ROS reviewed.  No complaints noted. ?-Patient to only use condoms as current birth control method.  Patient reminded of various birth control methods available if decides to switch.   ?-Declines STD screening today. ? ?2. Well woman exam with routine gynecological exam ?-CBE today, next due 07/2024 ?-PAP today.  Will await results.  ? ?-  IGP, rfx Aptima HPV ASCU ? ?3. Nexplanon removal ?-Please see Nexplanon note.  ? ? ? ? ?Return in about 1 year (around 07/24/2022) for Annual well-woman exam. ? ? ? ?Glenna Fellows, FNP ? ?

## 2021-07-23 NOTE — Progress Notes (Signed)
Pt here for PE, Pap and Nexplanon removal.  Berdie Ogren, RN ? ?

## 2021-07-23 NOTE — Progress Notes (Signed)
Nexplanon Removal ?Patient identified, informed consent performed, consent signed.   Appropriate time out taken. Nexplanon site identified.  Area prepped in usual sterile fashon. 3 ml of 1% lidocaine with Epinephrine was used to anesthetize the area at the distal end of the implant and along implant site. A small stab incision was made right beside the implant on the distal portion.  The Nexplanon rod was grasped using hemostats/manual and removed without difficulty.  There was minimal blood loss. There were no complications.  Steri-strips were applied over the small incision.  A pressure bandage was applied to reduce any bruising.  The patient tolerated the procedure well and was given post procedure instructions.   Sieanna Vanstone, FNP  ?

## 2021-07-30 LAB — IGP, RFX APTIMA HPV ASCU: PAP Smear Comment: 0

## 2022-01-01 ENCOUNTER — Inpatient Hospital Stay (HOSPITAL_COMMUNITY)
Admission: AD | Admit: 2022-01-01 | Discharge: 2022-01-01 | Disposition: A | Discharge status: Home / Self Care | Payer: Self-pay | Attending: Obstetrics & Gynecology | Admitting: Obstetrics & Gynecology

## 2022-01-01 ENCOUNTER — Encounter (HOSPITAL_COMMUNITY): Payer: Self-pay | Admitting: Obstetrics & Gynecology

## 2022-01-01 ENCOUNTER — Inpatient Hospital Stay (HOSPITAL_COMMUNITY): Payer: Self-pay

## 2022-01-01 ENCOUNTER — Other Ambulatory Visit: Payer: Self-pay

## 2022-01-01 DIAGNOSIS — N939 Abnormal uterine and vaginal bleeding, unspecified: Secondary | ICD-10-CM

## 2022-01-01 DIAGNOSIS — Z3491 Encounter for supervision of normal pregnancy, unspecified, first trimester: Secondary | ICD-10-CM

## 2022-01-01 DIAGNOSIS — Z3A01 Less than 8 weeks gestation of pregnancy: Secondary | ICD-10-CM | POA: Insufficient documentation

## 2022-01-01 DIAGNOSIS — O209 Hemorrhage in early pregnancy, unspecified: Secondary | ICD-10-CM | POA: Insufficient documentation

## 2022-01-01 DIAGNOSIS — O26891 Other specified pregnancy related conditions, first trimester: Secondary | ICD-10-CM | POA: Insufficient documentation

## 2022-01-01 LAB — URINALYSIS, ROUTINE W REFLEX MICROSCOPIC
Bacteria, UA: NONE SEEN
Bilirubin Urine: NEGATIVE
Glucose, UA: NEGATIVE mg/dL
Ketones, ur: 20 mg/dL — AB
Leukocytes,Ua: NEGATIVE
Nitrite: NEGATIVE
Protein, ur: NEGATIVE mg/dL
Specific Gravity, Urine: 1.024 (ref 1.005–1.030)
pH: 7 (ref 5.0–8.0)

## 2022-01-01 LAB — CBC
HCT: 36.1 % (ref 36.0–46.0)
Hemoglobin: 12.3 g/dL (ref 12.0–15.0)
MCH: 29.1 pg (ref 26.0–34.0)
MCHC: 34.1 g/dL (ref 30.0–36.0)
MCV: 85.5 fL (ref 80.0–100.0)
Platelets: 255 10*3/uL (ref 150–400)
RBC: 4.22 MIL/uL (ref 3.87–5.11)
RDW: 12.6 % (ref 11.5–15.5)
WBC: 9.7 10*3/uL (ref 4.0–10.5)
nRBC: 0 % (ref 0.0–0.2)

## 2022-01-01 LAB — ABO/RH: ABO/RH(D): O POS

## 2022-01-01 LAB — WET PREP, GENITAL
Clue Cells Wet Prep HPF POC: NONE SEEN
Sperm: NONE SEEN
Trich, Wet Prep: NONE SEEN
WBC, Wet Prep HPF POC: >=10 — AB (ref <10)
Yeast Wet Prep HPF POC: NONE SEEN

## 2022-01-01 LAB — POCT PREGNANCY, URINE: Preg Test, Ur: POSITIVE — AB

## 2022-01-01 LAB — HCG, QUANTITATIVE, PREGNANCY: hCG, Beta Chain, Quant, S: 127586 m[IU]/mL — ABNORMAL HIGH (ref <5)

## 2022-01-01 NOTE — MAU Note (Signed)
[name] Black is a 21 y.o. at Unknown here in MAU reporting: had a +HPT 3 weeks ago and started spotting this morning.  Endorses "a little bit" of abdominal cramping. LMP: 9/4/ Onset of complaint: today Pain score: 5 Vitals:   01/01/22 1650  BP: 137/70  Pulse: 91  Resp: 20  Temp: 98.4 F (36.9 C)  SpO2: 98%     FHT:N/A Lab orders placed from triage:  UPT

## 2022-01-01 NOTE — Discharge Instructions (Signed)
[facility]   [facility]             [address], [place], [place] [address] [phone]  [facility]                                                             [address], [address], [place], [place], [address] [phone]  [facility]                                    [address], [address], [place] [address] [phone]  [facility] [place] [address], [address], [place], [place], [address] [phone]  [facility] [facility]                                 [address], [place], [place], [address] [phone]  [facility]                                    [address], [place], [place], [address] [phone]  [facility] [address] [address], [address], [place], [place], [address]                              [facility] [address], [place], , [address] [phone]  [facility]         Phone: [phone]  [facility]      Phone: [phone]   [facility]      Phone: [phone]  [facility]         Phone: [phone]   [facility]    Phone: [phone]  [facility]      Phone: [phone]  [facility]     Phone: [phone]  [facility]        Phone: [phone]  [facility]      Phone: [phone]                   Safe Medications in Pregnancy    Acne: Benzoyl Peroxide Salicylic Acid  Backache/Headache: Tylenol: 2 regular strength every 4 hours OR              2 Extra strength every 6  hours  Colds/Coughs/Allergies: Benadryl (alcohol free) 25 mg every 6 hours as needed Breath right strips Claritin Cepacol throat lozenges Chloraseptic throat spray Cold-Eeze- up to three times per day Cough drops, alcohol free Flonase (by prescription only) Guaifenesin Mucinex Robitussin DM (plain only, alcohol free) Saline nasal spray/drops Sudafed (pseudoephedrine) & Actifed ** use only after [redacted] weeks gestation and if you do not have high blood pressure Tylenol Vicks Vaporub Zinc lozenges Zyrtec   Constipation: Colace Ducolax suppositories Fleet enema Glycerin   suppositories Metamucil Milk of magnesia Miralax Senokot Smooth move tea  Diarrhea: Kaopectate Imodium A-D  *NO pepto Bismol  Hemorrhoids: Anusol Anusol HC Preparation H Tucks  Indigestion: Tums Maalox Mylanta Zantac  Pepcid  Insomnia: Benadryl (alcohol free) 25mg every 6 hours as needed Tylenol PM Unisom, no Gelcaps  Leg Cramps: Tums MagGel  Nausea/Vomiting:  Bonine Dramamine Emetrol Ginger extract Sea bands Meclizine  Nausea medication to take during pregnancy:  Unisom (doxylamine succinate 25 mg tablets) Take one tablet daily at bedtime. If symptoms are not adequately controlled, the dose can be increased to a maximum recommended dose of two tablets daily (1/2 tablet in the morning, 1/2 tablet mid-afternoon and one at bedtime). Vitamin B6 100mg tablets. Take one tablet twice a day (up to 200 mg per day).  Skin Rashes: Aveeno products Benadryl cream or 25mg every 6 hours as needed Calamine Lotion 1% cortisone cream  Yeast infection: Gyne-lotrimin 7 Monistat 7   **If taking multiple medications, please check labels to avoid duplicating the same active ingredients **take medication as directed on the label ** Do not exceed 4000 mg of tylenol in 24 hours **Do not take medications that contain aspirin or ibuprofen    

## 2022-01-01 NOTE — MAU Provider Note (Signed)
History     CSN: [id number]  Arrival date and time: 01/01/22 1628   None     Chief Complaint  Patient presents with   Abdominal Pain   Vaginal Bleeding   HPI [name] Black is a 21 y.o. G1P0 at [redacted]w[redacted]d by LMP who presents to MAU for vaginal spotting. Patient reports she had a positive pregnancy test about 2-3 weeks ago. She reports she woke up this morning and noticed some brown spotting. She got home from work and took a nap and when she woke up she noticed a pad full of blood. There were no clots or tissues. She reports now bleeding is very minimal. She reports some mild lower abdominal cramping that started after the bleeding. She denies itching, odor, urinary s/s, recent intercourse, or pelvic exams. LMP: 11/11/2021.   OB History     Gravida  2   Para  1   Term      Preterm  1   AB      Living  1      SAB      IAB      Ectopic      Multiple  0   Live Births  1           Past Medical History:  Diagnosis Date   Anemia in pregnancy, third trimester 05/11/2019   Depression    Per record-2015 Drug Overdose   Hypertension    Per record-Hx malignant HTN   Latent tuberculosis 09/29/2019   Preterm labor 07/06/2019   PTSD (post-traumatic stress disorder)    UTI (urinary tract infection) during pregnancy 01/24/19 E. Coli 10-25,000 01/26/2019    Past Surgical History:  Procedure Laterality Date   NO PAST SURGERIES      Family History  Problem Relation Age of Onset   Hypertension Mother    Multiple births Maternal Aunt    Cancer Maternal Grandmother     Social History   Tobacco Use   Smoking status: Never   Smokeless tobacco: Never  Vaping Use   Vaping Use: Never used  Substance Use Topics   Alcohol use: Not Currently    Comment: Last ETOH-09/2018   Drug use: Not Currently    Types: Marijuana    Allergies: No Known Allergies  No medications prior to admission.   Review of Systems  Constitutional: Negative.   Respiratory: Negative.     Cardiovascular: Negative.   Gastrointestinal:  Positive for abdominal pain (cramping).  Genitourinary:  Positive for vaginal bleeding (spotting).  Musculoskeletal: Negative.   Neurological: Negative.    Physical Exam   Blood pressure 126/65, pulse 88, temperature 98.3 F (36.8 C), temperature source Oral, resp. rate 19, height 5\' 1"  (1.549 m), weight 78.5 kg, last menstrual period 11/11/2021, SpO2 100 %.  Physical Exam Vitals and nursing note reviewed.  Constitutional:      General: She is not in acute distress. Eyes:     Extraocular Movements: Extraocular movements intact.     Pupils: Pupils are equal, round, and reactive to light.  Cardiovascular:     Rate and Rhythm: Normal rate.  Pulmonary:     Effort: Pulmonary effort is normal.  Abdominal:     Palpations: Abdomen is soft.     Tenderness: There is no abdominal tenderness.  Genitourinary:    Comments: Self swabbed Musculoskeletal:        General: Normal range of motion.     Cervical back: Normal range of motion.  Skin:  General: Skin is warm and dry.  Neurological:     General: No focal deficit present.     Mental Status: She is alert and oriented to person, place, and time.  Psychiatric:        Mood and Affect: Mood normal.        Behavior: Behavior normal.    US OB Comp Less 14 Wks  Result Date: 01/01/2022 CLINICAL DATA:  [id number] Vaginal bleeding affecting early pregnancy [id number] EXAM: OBSTETRIC <14 WK ULTRASOUND TECHNIQUE: Transabdominal ultrasound was performed for evaluation of the gestation as well as the maternal uterus and adnexal regions. COMPARISON:  None Available. FINDINGS: Intrauterine gestational sac: Single Yolk sac:  Visualized. Embryo:  Visualized. Cardiac Activity: Visualized. Heart Rate: 137 bpm CRL:   11.0 mm   7 w 1 d                  [place] EDC: 08/19/2022 Subchorionic hemorrhage:  None visualized. Maternal uterus/adnexae: Unremarkable. IMPRESSION: 1. Single live intrauterine pregnancy as above  estimated age 65 weeks and 1 day. Electronically Signed   By: Randa Ngo M.D.   On: 01/01/2022 18:17    MAU Course  Procedures  MDM UA, CBC, HCG, ABO/RH, Wet prep, GC/CT, ultrasound  Labs unremarkable. Wet prep negative, GC/CT pending. Ultrasound shows SIUP with FHR present.   Recommend pelvic rest given vaginal spotting. Patient to establish [facility] with OBGYN of her choice. List of OBGYN's provided.   Assessment and Plan  [redacted] weeks gestation of pregnancy Vaginal spotting SIUP  - Discharge home in stable condition - Pelvic rest - Return to MAU as needed for new/worsening symptoms - Establish [facility] with OBGYN of choice   Renee Harder, CNM 01/01/2022, 6:45 PM

## 2022-01-02 LAB — GC/CHLAMYDIA PROBE AMP (~~LOC~~) NOT AT ARMC
Chlamydia: NEGATIVE
Comment: NEGATIVE
Comment: NORMAL
Neisseria Gonorrhea: NEGATIVE

## 2022-02-06 LAB — OB RESULTS CONSOLE RUBELLA ANTIBODY, IGM: Rubella: IMMUNE

## 2022-02-06 LAB — OB RESULTS CONSOLE RPR: RPR: NONREACTIVE

## 2022-02-06 LAB — HEPATITIS C ANTIBODY: HCV Ab: NEGATIVE

## 2022-02-06 LAB — OB RESULTS CONSOLE HEPATITIS B SURFACE ANTIGEN: Hepatitis B Surface Ag: NEGATIVE

## 2022-02-06 LAB — OB RESULTS CONSOLE HIV ANTIBODY (ROUTINE TESTING): HIV: NONREACTIVE

## 2022-03-10 NOTE — L&D Delivery Note (Signed)
OB/GYN Faculty Practice Delivery Note  [name] Black is a 22 y.o. [id number] s/p VD at [redacted]w[redacted]d. She was admitted for PPROM.   ROM: 16h 59m with MSF fluid GBS Status: unknown- PCN ppx   Maximum Maternal Temperature: 98.68F  Labor Progress: Initial SVE: 3/70/-3. She then progressed to complete.   Delivery Date/Time: 07/17/22 1815 Delivery: Called to room and patient was complete and pushing. Head delivered ROA. Nuchal cord present x1, reduced. Shoulder and body delivered in usual fashion. Infant with spontaneous cry, placed on mother's abdomen, dried and stimulated. Cord clamped x 2 after 1-minute delay, and cut by FOB. Cord blood drawn. Placenta delivered spontaneously with gentle cord traction. Fundus firm with massage and Pitocin. Labia, perineum, vagina, and cervix inspected with R periurethral laceration, hemostatic.  Baby Weight: pending  Placenta: 3 vessel, intact. Sent to L&D Complications: None Lacerations: as above EBL: 132 mL Analgesia: Epidural   Infant:  APGAR (1 MIN): 9   APGAR (5 MINS): 9    Myrtie Hawk, DO OB Family Medicine Fellow, [facility] for [employer], [facility] 07/17/2022, 6:31 PM

## 2022-07-17 ENCOUNTER — Inpatient Hospital Stay (HOSPITAL_COMMUNITY): Payer: Medicaid Other | Admitting: Anesthesiology

## 2022-07-17 ENCOUNTER — Inpatient Hospital Stay (HOSPITAL_COMMUNITY)
Admission: AD | Admit: 2022-07-17 | Discharge: 2022-07-19 | DRG: 805 | Disposition: A | Discharge status: Home / Self Care | Payer: Medicaid Other | Attending: Obstetrics and Gynecology | Admitting: Obstetrics and Gynecology

## 2022-07-17 ENCOUNTER — Encounter (HOSPITAL_COMMUNITY): Payer: Self-pay | Admitting: Obstetrics and Gynecology

## 2022-07-17 ENCOUNTER — Other Ambulatory Visit: Payer: Self-pay

## 2022-07-17 DIAGNOSIS — O42913 Preterm premature rupture of membranes, unspecified as to length of time between rupture and onset of labor, third trimester: Secondary | ICD-10-CM | POA: Diagnosis present

## 2022-07-17 DIAGNOSIS — O42919 Preterm premature rupture of membranes, unspecified as to length of time between rupture and onset of labor, unspecified trimester: Secondary | ICD-10-CM | POA: Diagnosis present

## 2022-07-17 DIAGNOSIS — F32A Depression, unspecified: Secondary | ICD-10-CM | POA: Diagnosis present

## 2022-07-17 DIAGNOSIS — F322 Major depressive disorder, single episode, severe without psychotic features: Secondary | ICD-10-CM | POA: Diagnosis present

## 2022-07-17 DIAGNOSIS — F4310 Post-traumatic stress disorder, unspecified: Secondary | ICD-10-CM | POA: Diagnosis present

## 2022-07-17 DIAGNOSIS — Z3A35 35 weeks gestation of pregnancy: Secondary | ICD-10-CM

## 2022-07-17 DIAGNOSIS — Z8615 Personal history of latent tuberculosis infection: Secondary | ICD-10-CM | POA: Diagnosis not present

## 2022-07-17 DIAGNOSIS — O99344 Other mental disorders complicating childbirth: Secondary | ICD-10-CM

## 2022-07-17 DIAGNOSIS — O99214 Obesity complicating childbirth: Secondary | ICD-10-CM | POA: Diagnosis present

## 2022-07-17 DIAGNOSIS — O99013 Anemia complicating pregnancy, third trimester: Secondary | ICD-10-CM | POA: Diagnosis present

## 2022-07-17 DIAGNOSIS — O9902 Anemia complicating childbirth: Secondary | ICD-10-CM | POA: Diagnosis present

## 2022-07-17 DIAGNOSIS — O99340 Other mental disorders complicating pregnancy, unspecified trimester: Secondary | ICD-10-CM | POA: Diagnosis present

## 2022-07-17 DIAGNOSIS — O42013 Preterm premature rupture of membranes, onset of labor within 24 hours of rupture, third trimester: Secondary | ICD-10-CM | POA: Diagnosis not present

## 2022-07-17 LAB — TYPE AND SCREEN
ABO/RH(D): O POS
Antibody Screen: NEGATIVE

## 2022-07-17 LAB — URINALYSIS, ROUTINE W REFLEX MICROSCOPIC
Bacteria, UA: NONE SEEN
Bilirubin Urine: NEGATIVE
Glucose, UA: NEGATIVE mg/dL
Ketones, ur: NEGATIVE mg/dL
Leukocytes,Ua: NEGATIVE
Nitrite: NEGATIVE
Protein, ur: NEGATIVE mg/dL
Specific Gravity, Urine: 1.018 (ref 1.005–1.030)
pH: 6 (ref 5.0–8.0)

## 2022-07-17 LAB — CBC
HCT: 34.2 % — ABNORMAL LOW (ref 36.0–46.0)
Hemoglobin: 11.3 g/dL — ABNORMAL LOW (ref 12.0–15.0)
MCH: 28.4 pg (ref 26.0–34.0)
MCHC: 33 g/dL (ref 30.0–36.0)
MCV: 85.9 fL (ref 80.0–100.0)
Platelets: 311 10*3/uL (ref 150–400)
RBC: 3.98 MIL/uL (ref 3.87–5.11)
RDW: 13.1 % (ref 11.5–15.5)
WBC: 7.8 10*3/uL (ref 4.0–10.5)
nRBC: 0 % (ref 0.0–0.2)

## 2022-07-17 LAB — WET PREP, GENITAL
Clue Cells Wet Prep HPF POC: NONE SEEN
Sperm: NONE SEEN
Trich, Wet Prep: NONE SEEN
WBC, Wet Prep HPF POC: >=10 — AB (ref <10)
Yeast Wet Prep HPF POC: NONE SEEN

## 2022-07-17 LAB — RPR: RPR Ser Ql: NONREACTIVE

## 2022-07-17 LAB — POCT FERN TEST: POCT Fern Test: POSITIVE

## 2022-07-17 MED ORDER — OXYCODONE-ACETAMINOPHEN 5-325 MG PO TABS: 2.0000 | ORAL_TABLET | ORAL | Status: DC | PRN

## 2022-07-17 MED ORDER — LACTATED RINGERS IV SOLN: 500.0000 mL | Freq: Once | INTRAVENOUS | Status: DC

## 2022-07-17 MED ORDER — SODIUM CHLORIDE 0.9% FLUSH: 3.0000 mL | INTRAVENOUS | Status: DC | PRN

## 2022-07-17 MED ORDER — DIBUCAINE (PERIANAL) 1 % EX OINT: 1.0000 | TOPICAL_OINTMENT | CUTANEOUS | Status: DC | PRN

## 2022-07-17 MED ORDER — EPHEDRINE 5 MG/ML INJ: 10.0000 mg | INTRAVENOUS | Status: DC | PRN

## 2022-07-17 MED ORDER — FLEET ENEMA 7-19 GM/118ML RE ENEM: 1.0000 | ENEMA | RECTAL | Status: DC | PRN

## 2022-07-17 MED ORDER — SENNOSIDES-DOCUSATE SODIUM 8.6-50 MG PO TABS
2.0000 | ORAL_TABLET | ORAL | Status: DC
  Administered 2022-07-18 – 2022-07-19 (×2): 2 via ORAL
  Filled 2022-07-17 (×2): qty 2

## 2022-07-17 MED ORDER — SODIUM CHLORIDE 0.9 % IV SOLN
5.0000 10*6.[IU] | Freq: Once | INTRAVENOUS | Status: AC
  Administered 2022-07-17: 5 10*6.[IU] via INTRAVENOUS
  Filled 2022-07-17: qty 5

## 2022-07-17 MED ORDER — OXYTOCIN-SODIUM CHLORIDE 30-0.9 UT/500ML-% IV SOLN
2.5000 [IU]/h | INTRAVENOUS | Status: DC
  Filled 2022-07-17: qty 500

## 2022-07-17 MED ORDER — SODIUM CHLORIDE 0.9 % IV SOLN: 250.0000 mL | INTRAVENOUS | Status: DC | PRN

## 2022-07-17 MED ORDER — SOD CITRATE-CITRIC ACID 500-334 MG/5ML PO SOLN: 30.0000 mL | ORAL | Status: DC | PRN

## 2022-07-17 MED ORDER — PHENYLEPHRINE 80 MCG/ML (10ML) SYRINGE FOR IV PUSH (FOR BLOOD PRESSURE SUPPORT): 80.0000 ug | PREFILLED_SYRINGE | INTRAVENOUS | Status: DC | PRN

## 2022-07-17 MED ORDER — FENTANYL-BUPIVACAINE-NACL 0.5-0.125-0.9 MG/250ML-% EP SOLN
12.0000 mL/h | EPIDURAL | Status: DC | PRN
  Administered 2022-07-17: 12 mL/h via EPIDURAL
  Filled 2022-07-17: qty 250

## 2022-07-17 MED ORDER — LACTATED RINGERS IV SOLN: 500.0000 mL | INTRAVENOUS | Status: DC | PRN

## 2022-07-17 MED ORDER — ACETAMINOPHEN 325 MG PO TABS: 650.0000 mg | ORAL_TABLET | ORAL | Status: DC | PRN

## 2022-07-17 MED ORDER — PRENATAL MULTIVITAMIN CH
1.0000 | ORAL_TABLET | Freq: Every day | ORAL | Status: DC
  Administered 2022-07-19: 1 via ORAL
  Filled 2022-07-17 (×2): qty 1

## 2022-07-17 MED ORDER — PENICILLIN G POT IN DEXTROSE 60000 UNIT/ML IV SOLN
3.0000 10*6.[IU] | INTRAVENOUS | Status: DC
  Administered 2022-07-17: 3 10*6.[IU] via INTRAVENOUS
  Filled 2022-07-17: qty 50

## 2022-07-17 MED ORDER — IBUPROFEN 600 MG PO TABS
600.0000 mg | ORAL_TABLET | Freq: Four times a day (QID) | ORAL | Status: DC
  Administered 2022-07-18 – 2022-07-19 (×7): 600 mg via ORAL
  Filled 2022-07-17 (×7): qty 1

## 2022-07-17 MED ORDER — ZOLPIDEM TARTRATE 5 MG PO TABS: 5.0000 mg | ORAL_TABLET | Freq: Every evening | ORAL | Status: DC | PRN

## 2022-07-17 MED ORDER — ONDANSETRON HCL 4 MG/2ML IJ SOLN: 4.0000 mg | INTRAMUSCULAR | Status: DC | PRN

## 2022-07-17 MED ORDER — WITCH HAZEL-GLYCERIN EX PADS: 1.0000 | MEDICATED_PAD | CUTANEOUS | Status: DC | PRN

## 2022-07-17 MED ORDER — BENZOCAINE-MENTHOL 20-0.5 % EX AERO: 1.0000 | INHALATION_SPRAY | CUTANEOUS | Status: DC | PRN

## 2022-07-17 MED ORDER — LACTATED RINGERS IV SOLN: INTRAVENOUS | Status: DC

## 2022-07-17 MED ORDER — OXYCODONE-ACETAMINOPHEN 5-325 MG PO TABS: 1.0000 | ORAL_TABLET | ORAL | Status: DC | PRN

## 2022-07-17 MED ORDER — LIDOCAINE HCL (PF) 1 % IJ SOLN: 30.0000 mL | INTRAMUSCULAR | Status: DC | PRN

## 2022-07-17 MED ORDER — ONDANSETRON HCL 4 MG PO TABS: 4.0000 mg | ORAL_TABLET | ORAL | Status: DC | PRN

## 2022-07-17 MED ORDER — ONDANSETRON HCL 4 MG/2ML IJ SOLN: 4.0000 mg | Freq: Four times a day (QID) | INTRAMUSCULAR | Status: DC | PRN

## 2022-07-17 MED ORDER — OXYTOCIN BOLUS FROM INFUSION
333.0000 mL | Freq: Once | INTRAVENOUS | Status: AC
  Administered 2022-07-17: 333 mL via INTRAVENOUS

## 2022-07-17 MED ORDER — SODIUM CHLORIDE 0.9% FLUSH: 3.0000 mL | Freq: Two times a day (BID) | INTRAVENOUS | Status: DC

## 2022-07-17 MED ORDER — LIDOCAINE-EPINEPHRINE (PF) 2 %-1:200000 IJ SOLN
INTRAMUSCULAR | Status: DC | PRN
  Administered 2022-07-17: 5 mL via EPIDURAL

## 2022-07-17 MED ORDER — SIMETHICONE 80 MG PO CHEW: 80.0000 mg | CHEWABLE_TABLET | ORAL | Status: DC | PRN

## 2022-07-17 MED ORDER — COCONUT OIL OIL: 1.0000 | TOPICAL_OIL | Status: DC | PRN

## 2022-07-17 MED ORDER — ACETAMINOPHEN 325 MG PO TABS
650.0000 mg | ORAL_TABLET | ORAL | Status: DC | PRN
  Administered 2022-07-18: 650 mg via ORAL
  Filled 2022-07-17: qty 2

## 2022-07-17 MED ORDER — DIPHENHYDRAMINE HCL 50 MG/ML IJ SOLN: 12.5000 mg | INTRAMUSCULAR | Status: DC | PRN

## 2022-07-17 MED ORDER — DIPHENHYDRAMINE HCL 25 MG PO CAPS: 25.0000 mg | ORAL_CAPSULE | Freq: Four times a day (QID) | ORAL | Status: DC | PRN

## 2022-07-17 NOTE — H&P (Addendum)
OBSTETRIC ADMISSION HISTORY AND PHYSICAL  [name] Black is a 22 y.o. female G80P0101 with IUP at [redacted]w[redacted]d by 7 wk [place] presenting for PPROM. She reports +FMs, No LOF, no VB, no blurry vision, headaches or peripheral edema, and RUQ pain.  She plans on breast feeding. She request nexplanon at HD for birth control. 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 Coli 10-25,000 01/26/2019    Past Surgical History: Past Surgical History:  Procedure Laterality Date   NO PAST SURGERIES      Obstetrical History: OB History     Gravida  2   Para  1   Term      Preterm  1   AB      Living  1      SAB      IAB      Ectopic      Multiple  0   Live Births  1           Social History Social History   Socioeconomic History   Marital status: Single    Spouse name: Not on file   Number of children: Not on file   Years of education: 11   Highest education level: Not on file  Occupational History   Occupation: Consulting civil engineer  Tobacco Use   Smoking status: Never   Smokeless tobacco: Never  Vaping Use   Vaping Use: Never used  Substance and Sexual Activity   Alcohol use: Not Currently    Comment: Last ETOH-09/2018   Drug use: Not Currently    Types: Marijuana   Sexual activity: Not Currently    Comment: currently- implant Hx Depo per record-last use years ago  Other Topics Concern   Not on file  Social History Narrative   Lives  with FOB and family    Social Determinants of Health   Financial Resource Strain: Not on file  Food Insecurity: Not on file  Transportation Needs: No Transportation Needs (01/24/2019)   PRAPARE - Administrator, Civil Service (Medical): No    Lack of Transportation (Non-Medical): No  Physical Activity: Not on file  Stress: Not on file  Social Connections: Not on file    Family History: Family History  Problem Relation Age of Onset   Hypertension Mother    Multiple births Maternal Aunt    Cancer Maternal Grandmother     Allergies: No Known Allergies  Medications Prior to Admission  Medication Sig Dispense Refill Last Dose   hydrOXYzine (VISTARIL) 25 MG capsule Take 25 mg by mouth 3 (three) times daily as needed for anxiety.   Past Week   Prenatal Vit-Fe Fumarate-FA (PRENATAL PO) Take 1 tablet by mouth daily.   07/16/2022     Review of Systems   All systems reviewed and negative except as stated in HPI  Blood pressure (!) 141/82, pulse 89, temperature 98.4 F (36.9 C), temperature source Oral, resp.  rate 18, height 5\' 2"  (1.575 m), weight 81.1 kg, last menstrual period 11/11/2021, SpO2 100 %. General appearance: alert, cooperative, and appears stated age Lungs: clear to auscultation bilaterally Heart: regular rate and rhythm Abdomen: soft, non-tender; bowel sounds normal Pelvic: no lesions Extremities: Homans sign is negative, no sign of DVT Presentation: cephalic Fetal monitoringBaseline: 135 bpm, Variability: Good {> 6 bpm), Accelerations: Reactive, and Decelerations: Absent Uterine activityFrequency: q 3-4 min Dilation: 3 Effacement (%): 70 Station: -3 Exam by:: Judeth Horn NP   Prenatal labs: ABO, Rh: --/--/PENDING (05/09 1158) Antibody: PENDING (05/09 1158) Rubella:  Immune RPR:   NR HBsAg:   NR HIV:   NR GBS:   pending 1 hr Glucola abml, 3hr gtt nml Genetic screening  not done Anatomy US nml  Prenatal Transfer Tool  Maternal Diabetes:  No Genetic Screening: Normal Maternal Ultrasounds/Referrals: Normal Fetal Ultrasounds or other Referrals:  None Maternal Substance Abuse:  No Significant Maternal Medications:  None Significant Maternal Lab Results:  Other: GBS unknown Number of Prenatal Visits:greater than 3 verified prenatal visits Other Comments:  None  Results for orders placed or performed during the hospital encounter of 07/17/22 (from the past 24 hour(s))  Urinalysis, Routine w reflex microscopic -Urine, Clean Catch   Collection Time: 07/17/22 11:39 AM  Result Value Ref Range   Color, Urine AMBER (A) YELLOW   APPearance CLEAR CLEAR   Specific Gravity, Urine 1.018 1.005 - 1.030   pH 6.0 5.0 - 8.0   Glucose, UA NEGATIVE NEGATIVE mg/dL   Hgb urine dipstick MODERATE (A) NEGATIVE   Bilirubin Urine NEGATIVE NEGATIVE   Ketones, ur NEGATIVE NEGATIVE mg/dL   Protein, ur NEGATIVE NEGATIVE mg/dL   Nitrite NEGATIVE NEGATIVE   Leukocytes,Ua NEGATIVE NEGATIVE   RBC / HPF 11-20 0 - 5 RBC/hpf   WBC, UA 0-5 0 - 5 WBC/hpf   Bacteria, UA NONE SEEN NONE SEEN   Squamous Epithelial / HPF 0-5 0 - 5 /HPF   Mucus PRESENT   Wet prep, genital   Collection Time: 07/17/22 11:44 AM   Specimen: Vaginal  Result Value Ref Range   Yeast Wet Prep HPF POC NONE SEEN NONE SEEN   Trich, Wet Prep NONE SEEN NONE SEEN   Clue Cells Wet Prep HPF POC NONE SEEN NONE SEEN   WBC, Wet Prep HPF POC >=10 (A) <10   Sperm NONE SEEN   POCT fern test   Collection Time: 07/17/22 11:44 AM  Result Value Ref Range   POCT Fern Test Positive = ruptured amniotic membanes   Type and screen [facility]   Collection Time: 07/17/22 11:58 AM  Result Value Ref Range   ABO/RH(D) PENDING    Antibody Screen PENDING    Sample Expiration      07/20/2022,2359 Performed at [facility], 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 Coli 10-25,000 01/26/2019   Depression affecting pregnancy, antepartum 01/24/2019   MDD (major depressive disorder), single episode, severe , no psychosis (HCC) 03/20/2014   PTSD (post-traumatic stress disorder) 03/20/2014   Intentional drug  overdose [facility]) age 24 on mom's rx--inpt [facility] x 5 days 03/16/2014    Assessment/Plan:  [name] Black is a 22 y.o. G2P0101 at [redacted]w[redacted]d here for PPROM  #Labor: expectant management since she's already contracting regularly #Pain: Per pt request #FWB: CAT 1 #ID:  GBS pending- PCN started #MOF:  breast #MOC: nexplanon at HD  Myrtie Hawk, DO  07/17/2022, 12:25 PM

## 2022-07-17 NOTE — Anesthesia Preprocedure Evaluation (Signed)
Anesthesia Evaluation  Patient identified by MRN, date of birth, ID band Patient awake    Reviewed: Allergy & Precautions, NPO status , Patient's Chart, lab work & pertinent test results  Airway Mallampati: II  TM Distance: >3 FB Neck ROM: Full    Dental no notable dental hx.    Pulmonary neg pulmonary ROS   Pulmonary exam normal breath sounds clear to auscultation       Cardiovascular negative cardio ROS Normal cardiovascular exam Rhythm:Regular Rate:Normal     Neuro/Psych  PSYCHIATRIC DISORDERS Anxiety Depression    negative neurological ROS     GI/Hepatic negative GI ROS, Neg liver ROS,,,  Endo/Other  negative endocrine ROS    Renal/GU negative Renal ROS  negative genitourinary   Musculoskeletal negative musculoskeletal ROS (+)    Abdominal   Peds  Hematology negative hematology ROS (+)   Anesthesia Other Findings Presents with PPROM  Reproductive/Obstetrics (+) Pregnancy                             Anesthesia Physical Anesthesia Plan  ASA: 2  Anesthesia Plan: Epidural   Post-op Pain Management:    Induction:   PONV Risk Score and Plan: Treatment may vary due to age or medical condition  Airway Management Planned: Natural Airway  Additional Equipment:   Intra-op Plan:   Post-operative Plan:   Informed Consent: I have reviewed the patients History and Physical, chart, labs and discussed the procedure including the risks, benefits and alternatives for the proposed anesthesia with the patient or authorized representative who has indicated his/her understanding and acceptance.       Plan Discussed with: Anesthesiologist  Anesthesia Plan Comments: (Patient identified. Risks, benefits, options discussed with patient including but not limited to bleeding, infection, nerve damage, paralysis, failed block, incomplete pain control, headache, blood pressure changes, nausea,  vomiting, reactions to medication, itching, and post partum back pain. Confirmed with bedside nurse the patient's most recent platelet count. Confirmed with the patient that they are not taking any anticoagulation, have any bleeding history or any family history of bleeding disorders. Patient expressed understanding and wishes to proceed. All questions were answered. )       Anesthesia Quick Evaluation

## 2022-07-17 NOTE — MAU Note (Signed)
Called Main Lab to request Wet Prep to be ran as it was sent at 1144.

## 2022-07-17 NOTE — Discharge Summary (Signed)
Postpartum Discharge Summary  Patient Name: [name] Black DOB: December 10, 2000 MRN: [id number]  Date of admission: 07/17/2022 Delivery date:07/17/2022  Delivering provider: Myrtie Hawk  Date of discharge: 07/19/2022  Admitting diagnosis: Preterm premature rupture of membranes (PPROM) with unknown onset of labor [O42.919] Intrauterine pregnancy: [redacted]w[redacted]d     Secondary diagnosis:  Principal Problem:   Preterm delivery Active Problems:   MDD (major depressive disorder), single episode, severe , no psychosis (HCC)   PTSD (post-traumatic stress disorder)   Depression affecting pregnancy, antepartum   Anemia in pregnancy, third trimester   Preterm premature rupture of membranes (PPROM) with unknown onset of labor  Additional problems: n/a    Discharge diagnosis: Preterm Pregnancy Delivered and Anemia                                              Post partum procedures: n/a Augmentation: AROM Complications: None  Hospital course: Onset of Labor With Vaginal Delivery      22 y.o. yo G2P0101 at 102w3d was admitted in Latent Labor on 07/17/2022. Labor course was complicated by none.  Membrane Rupture Time/Date: 2:00 AM ,07/17/2022   Delivery Method:Vaginal, Spontaneous  Episiotomy: None  Lacerations:  Periurethral  Patient had a postpartum course complicated by none.  She is ambulating, tolerating a regular diet, passing flatus, and urinating well. Patient is discharged home in stable condition on 07/19/22.  Newborn Data: Birth date:07/17/2022  Birth time:6:15 PM  Gender:Female  Living status:Living  Apgars:9 ,9  Weight:2470 g   Magnesium Sulfate received: No BMZ received: No Rhophylac:N/A MMR:N/A T-DaP:Given prenatally Flu: Yes Transfusion:No  Physical exam  Vitals:   07/18/22 0923 07/18/22 1223 07/18/22 1922 07/19/22 0532  BP: 124/62 (!) 124/56 129/69 109/63  Pulse: 78 79 75 67  Resp: 16 17 16 16   Temp: 98.8 F (37.1 C) 98.6 F (37 C) 99.2 F (37.3 C) 97.8 F (36.6  C)  TempSrc: Oral Oral Oral Oral  SpO2:  98%    Weight:      Height:       General: alert, cooperative, and no distress Lochia: appropriate Uterine Fundus: firm Incision: N/A DVT Evaluation: No evidence of DVT seen on physical exam. Labs: Lab Results  Component Value Date   WBC 8.8 07/18/2022   HGB 11.0 (L) 07/18/2022   HCT 32.4 (L) 07/18/2022   MCV 83.7 07/18/2022   PLT 270 07/18/2022      Latest Ref Rng & Units 06/19/2020   11:30 AM  CMP  Total Protein 6.0 - 8.5 g/dL 7.7   Total Bilirubin 0.0 - 1.2 mg/dL 0.3   Alkaline Phos 42 - 106 IU/L 144   AST 0 - 40 IU/L 11   ALT 0 - 32 IU/L 12    Edinburgh Score:    07/18/2022    5:44 PM  Edinburgh Postnatal Depression Scale Screening Tool  I have been able to laugh and see the funny side of things. 0  I have looked forward with enjoyment to things. 0  I have blamed myself unnecessarily when things went wrong. 0  I have been anxious or worried for no good reason. 0  I have felt scared or panicky for no good reason. 0  Things have been getting on top of me. 0  I have been so unhappy that I have had difficulty sleeping. 0  I have felt sad or  miserable. 0  I have been so unhappy that I have been crying. 0  The thought of harming myself has occurred to me. 0  Edinburgh Postnatal Depression Scale Total 0     After visit meds:  Allergies as of 07/19/2022   No Known Allergies      Medication List     STOP taking these medications    hydrOXYzine 25 MG capsule Commonly known as: VISTARIL       TAKE these medications    acetaminophen 325 MG tablet Commonly known as: Tylenol Take 2 tablets (650 mg total) by mouth every 4 (four) hours as needed (for pain scale < 4).   benzocaine-Menthol 20-0.5 % Aero Commonly known as: DERMOPLAST Apply 1 Application topically as needed for irritation (perineal discomfort).   ibuprofen 600 MG tablet Commonly known as: ADVIL Take 1 tablet (600 mg total) by mouth every 6 (six)  hours.   PRENATAL PO Take 1 tablet by mouth daily.   senna-docusate 8.6-50 MG tablet Commonly known as: Senokot-S Take 2 tablets by mouth daily.         Discharge home in stable condition Infant Feeding: Bottle and Breast Infant Disposition:home with mother Discharge instruction: per After Visit Summary and Postpartum booklet. Activity: Advance as tolerated. Pelvic rest for 6 weeks.  Diet: routine diet Future Appointments:No future appointments. Follow up Visit: Pt to f/up with [facility]  07/19/2022 Myrtie Hawk, DO

## 2022-07-17 NOTE — Anesthesia Procedure Notes (Signed)
Epidural Patient location during procedure: OB Start time: 07/17/2022 2:00 PM End time: 07/17/2022 2:10 PM  Staffing Anesthesiologist: Elmer Picker, MD Performed: anesthesiologist   Preanesthetic Checklist Completed: patient identified, IV checked, risks and benefits discussed, monitors and equipment checked, pre-op evaluation and timeout performed  Epidural Patient position: sitting Prep: DuraPrep and site prepped and draped Patient monitoring: continuous pulse ox, blood pressure, heart rate and cardiac monitor Approach: midline Location: L3-L4 Injection technique: LOR air  Needle:  Needle type: Tuohy  Needle gauge: 17 G Needle length: 9 cm Needle insertion depth: 6 cm Catheter type: closed end flexible Catheter size: 19 Gauge Catheter at skin depth: 11 cm Test dose: negative  Assessment Sensory level: T8 Events: blood not aspirated, no cerebrospinal fluid, injection not painful, no injection resistance, no paresthesia and negative IV test  Additional Notes Patient identified. Risks/Benefits/Options discussed with patient including but not limited to bleeding, infection, nerve damage, paralysis, failed block, incomplete pain control, headache, blood pressure changes, nausea, vomiting, reactions to medication both or allergic, itching and postpartum back pain. Confirmed with bedside nurse the patient's most recent platelet count. Confirmed with patient that they are not currently taking any anticoagulation, have any bleeding history or any family history of bleeding disorders. Patient expressed understanding and wished to proceed. All questions were answered. Sterile technique was used throughout the entire procedure. Please see nursing notes for vital signs. Test dose was given through epidural catheter and negative prior to continuing to dose epidural or start infusion. Warning signs of high block given to the patient including shortness of breath, tingling/numbness in hands,  complete motor block, or any concerning symptoms with instructions to call for help. Patient was given instructions on fall risk and not to get out of bed. All questions and concerns addressed with instructions to call with any issues or inadequate analgesia.  Reason for block:procedure for pain

## 2022-07-17 NOTE — MAU Provider Note (Addendum)
History     [id number]  Arrival date and time: 07/17/22 1101    Chief Complaint  Patient presents with   Abdominal Pain   Vaginal Bleeding   Vaginal Discharge     HPI [name] Black is a 22 y.o. G2P0101 at [redacted]w[redacted]d who presents for vaginal bleeding & discharge. Symptoms started early this morning. Reports bloody watery discharge. Also reports feeling painful contractions every 10-15 minutes. Denies recent intercourse. Reports good fetal movement.    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 Coli 10-25,000 01/26/2019    Past Surgical History:  Procedure Laterality Date   NO PAST SURGERIES      Family History  Problem Relation Age of Onset   Hypertension Mother    Multiple births Maternal Aunt    Cancer Maternal Grandmother     Social History   Socioeconomic History   Marital status: Single    Spouse name: Not on file   Number of children: Not on file   Years of education: 11   Highest education level: Not on file  Occupational History   Occupation: Consulting civil engineer  Tobacco Use   Smoking status: Never   Smokeless tobacco: Never  Vaping Use   Vaping Use: Never used  Substance and Sexual Activity   Alcohol use: Not Currently    Comment: Last ETOH-09/2018   Drug use: Not Currently    Types: Marijuana   Sexual activity: Not Currently    Comment: currently- implant Hx Depo per record-last use years ago  Other Topics  Concern   Not on file  Social History Narrative   Lives with FOB and family    Social Determinants of Health   Financial Resource Strain: Not on file  Food Insecurity: Not on file  Transportation Needs: No Transportation Needs (01/24/2019)   PRAPARE - Administrator, Civil Service (Medical): No    Lack of Transportation (Non-Medical): No  Physical Activity: Not on file  Stress: Not on file  Social Connections: Not on file  Intimate Partner Violence: Not At Risk (07/23/2021)   Humiliation, Afraid, Rape, and Kick questionnaire    Fear of Current or Ex-Partner: No    Emotionally Abused: No    Physically Abused: No    Sexually Abused: No    No Known Allergies  No current facility-administered medications on file prior to encounter.   Current Outpatient Medications on File Prior to Encounter  Medication Sig Dispense Refill   hydrOXYzine (VISTARIL) 25 MG capsule Take 25 mg by mouth 3 (three) times daily as needed.     Prenatal Vit-Fe Fumarate-FA (MULTIVITAMIN-PRENATAL) 27-0.8 MG TABS tablet Take 1 tablet by  mouth daily at 12 noon.       ROS Pertinent positives and negative per HPI, all others reviewed and negative  Physical Exam   BP 124/78 (BP Location: Left Arm)   Pulse 100   Temp 98.1 F (36.7 C)   Resp 16   Ht 5\' 2"  (1.575 m)   Wt 81.1 kg   LMP 11/11/2021   SpO2 100%   BMI 32.68 kg/m   Patient Vitals for the past 24 hrs:  BP Temp Pulse Resp SpO2 Height Weight  07/17/22 1130 124/78 98.1 F (36.7 C) 100 16 100 % 5\' 2"  (1.575 m) 81.1 kg    Physical Exam Vitals and nursing note reviewed. Exam conducted with a chaperone present.  Constitutional:      General: She is not in acute distress.    Appearance: Normal appearance. She is not ill-appearing.  HENT:     Head: Normocephalic and atraumatic.  Eyes:     General: No scleral icterus.    Pupils: Pupils are equal, round, and reactive to light.  Pulmonary:     Effort: Pulmonary effort is normal. No  respiratory distress.  Abdominal:     Tenderness: There is no abdominal tenderness.     Comments: gravid  Genitourinary:    Comments: Pelvic: no active bleeding. Scant amount of pink tinged pooling Skin:    General: Skin is warm and dry.  Neurological:     Mental Status: She is alert.      Cervical Exam Dilation: 3 Effacement (%): 70 Cervical Position: Middle Station: -3 Presentation: Vertex Exam by:: Judeth Horn NP   FHT Baseline 140, moderate variability, 15x15 accels, no decels Toco: Q 5-6 minutes Cat: 1  Labs Results for orders placed or performed during the hospital encounter of 07/17/22 (from the past 24 hour(s))  POCT fern test     Status: Abnormal   Collection Time: 07/17/22 11:44 AM  Result Value Ref Range   POCT Fern Test Positive = ruptured amniotic membanes     Imaging No results found.  MAU Course  Procedures Lab Orders         Wet prep, genital         Culture, beta strep (group b only)         Urinalysis, Routine w reflex microscopic -Urine, Clean Catch         POCT fern test    No orders of the defined types were placed in this encounter.  Imaging Orders  No imaging studies ordered today    MDM severe  Assessment and Plan   1. Preterm premature rupture of membranes (PPROM) with unknown onset of labor   2. [redacted] weeks gestation of pregnancy    -Admit to birthing suites  Judeth Horn, NP 07/17/22 11:51 AM

## 2022-07-17 NOTE — Progress Notes (Signed)
Labor Progress Note  [name] Black is a 22 y.o. G2P0101 at [redacted]w[redacted]d presented for PPROM  S: Pt has epidural, agrees with AROM of forebag  O:  BP 127/72   Pulse 81   Temp 98.1 F (36.7 C) (Oral)   Resp 18   Ht 5\' 2"  (1.575 m)   Wt 81.1 kg   LMP 11/11/2021   SpO2 99%   BMI 32.68 kg/m  EFM: 135 bpm/Mild variability/ 15x15 accels/ None decels  CVE: Dilation: 5 Effacement (%): 100 Cervical Position: Middle Station: -2 Presentation: Vertex Exam by:: mercado   A&P: 22 y.o. [id number] [redacted]w[redacted]d  here for PPROM as above  #Labor: Progressing well. AROM forebag MSF #Pain: Family/Friend support and Epidural #FWB: CAT 1 #GBS  pending- PCN ongoing  Myrtie Hawk, DO FMOB Fellow, Faculty practice [facility], Center for [facility] 07/17/22  4:15 PM

## 2022-07-17 NOTE — MAU Note (Signed)
.  [name] Black is a 22 y.o. at [redacted]w[redacted]d here in MAU reporting: that at 2am she had a lot of mucous discharge with some blood. This continued until 5 am and she started have lower abd cramping.reports positive fetal movement.  Onset of complaint: 2am Pain score: 4/10 Vitals:   07/17/22 1130  BP: 124/78  Pulse: 100  Resp: 16  Temp: 98.1 F (36.7 C)  SpO2: 100%     FHT:153 Lab orders placed from triage:

## 2022-07-18 LAB — CBC
HCT: 32.4 % — ABNORMAL LOW (ref 36.0–46.0)
Hemoglobin: 11 g/dL — ABNORMAL LOW (ref 12.0–15.0)
MCH: 28.4 pg (ref 26.0–34.0)
MCHC: 34 g/dL (ref 30.0–36.0)
MCV: 83.7 fL (ref 80.0–100.0)
Platelets: 270 10*3/uL (ref 150–400)
RBC: 3.87 MIL/uL (ref 3.87–5.11)
RDW: 13.2 % (ref 11.5–15.5)
WBC: 8.8 10*3/uL (ref 4.0–10.5)
nRBC: 0 % (ref 0.0–0.2)

## 2022-07-18 LAB — GC/CHLAMYDIA PROBE AMP (~~LOC~~) NOT AT ARMC
Chlamydia: NEGATIVE
Comment: NEGATIVE
Comment: NORMAL
Neisseria Gonorrhea: NEGATIVE

## 2022-07-18 NOTE — Social Work (Signed)
[name] Black was referred for history of depression.  * Referral screened out by Clinical Social Worker because none of the following criteria appear to apply:  ~ History of anxiety/depression during this pregnancy, or of post-partum depression following prior delivery.  ~ Diagnosis of anxiety and/or depression within last 3 years. Per chart review [name] Black was diagnosed in 2016, No concerns noted during pregnancy.  OR * [name] Black's symptoms currently being treated with medication and/or therapy.  Please contact the Clinical Social Worker if needs arise, by [facility] request, or if [name] Black scores greater than 9/yes to question 10 on Edinburgh Postpartum Depression Screen.  Wende Neighbors, LCSWA Clinical Social Worker [phone]

## 2022-07-18 NOTE — Lactation Note (Signed)
This note was copied from a baby's chart. Lactation Consultation Note  Patient Name: Girl Ariyanah Simms [id number] Date: 07/18/2022 Age:23 hours  Attempted to see mom but everyone in rm. Sleeping.   Maternal Data    Feeding Nipple Type: Slow - flow  LATCH Score                    Lactation Tools Discussed/Used    Interventions    Discharge    Consult Status      Skylene Deremer, Diamond Nickel 07/18/2022, 12:40 AM

## 2022-07-18 NOTE — Progress Notes (Signed)
POSTPARTUM PROGRESS NOTE  Post Partum Day 1  Subjective:  [name] Black is a 22 y.o. [id number] s/p SVD at [redacted]w[redacted]d.  She reports she is doing well. No acute events overnight. She denies any problems with ambulating, voiding or po intake. Denies nausea, vomiting, shortness of breath, cough and dizziness.  Pain is well controlled.  Lochia is bloody without clots.  Objective: Blood pressure 122/78, pulse 73, temperature 99.3 F (37.4 C), temperature source Oral, resp. rate 17, height 5\' 2"  (1.575 m), weight 81.1 kg, last menstrual period 11/11/2021, SpO2 97 %, unknown if currently breastfeeding.  Physical Exam:  General: alert, cooperative and no distress Chest: no respiratory distress Heart:regular rate, distal pulses intact Abdomen: soft, nontender,  Uterine Fundus: firm, appropriately tender DVT Evaluation: No calf swelling or tenderness Extremities: No lower extremity edema Skin: warm, dry  Recent Labs    07/17/22 1206 07/18/22 0515  HGB 11.3* 11.0*  HCT 34.2* 32.4*    Assessment/Plan: [name] Black is a 22 y.o. [id number] s/p SVD at [redacted]w[redacted]d   PPD#1 - Doing well  Routine postpartum care Follow-up with Health department regarding hx of latent TB. Contraception: Planning Nexplanon at her postpartum appointment  Feeding: Breast  Dispo: Plan for discharge 5/11.   LOS: 1 day   Kristie Cowman, MS3 07/18/2022, 7:31 AM   CNM attestation:  I have seen and examined this patient and agree with above documentation in the medical student's note.   [name] Black is a 22 y.o. [id number] PPD #1 S/P NSVD   PE: Patient Vitals for the past 24 hrs:  BP Temp Temp src Pulse Resp SpO2 Height Weight  07/18/22 0923 124/62 98.8 F (37.1 C) Oral 78 16 -- -- --  07/18/22 0545 122/78 99.3 F (37.4 C) Oral 73 17 -- -- --  07/18/22 0128 110/67 99.6 F (37.6 C) Oral 89 16 -- -- --  07/17/22 2116 120/65 98.9 F (37.2 C) Oral 87 17 -- -- --  07/17/22 2014 123/77 98.7 F (37.1 C) Oral 76  18 97 % -- --  07/17/22 1946 110/73 -- -- 74 -- -- -- --  07/17/22 1931 115/84 -- -- 80 -- -- -- --  07/17/22 1916 118/81 -- -- 76 -- -- -- --  07/17/22 1845 115/64 98.1 F (36.7 C) Oral 82 -- -- -- --  07/17/22 1831 116/66 -- -- 88 17 -- -- --  07/17/22 1801 (!) 110/55 -- -- 86 -- -- -- --  07/17/22 1748 -- 97.7 F (36.5 C) Bladder -- 16 -- -- --  07/17/22 1731 114/68 -- -- 98 -- -- -- --  07/17/22 1701 117/64 -- -- 93 -- -- -- --  07/17/22 1631 (!) 103/55 -- -- 80 -- -- -- --  07/17/22 1601 127/72 -- -- 81 -- -- -- --  07/17/22 1531 112/68 98.1 F (36.7 C) Oral 78 18 -- -- --  07/17/22 1501 126/69 -- -- 86 -- -- -- --  07/17/22 1440 127/82 -- -- 86 -- -- -- --  07/17/22 1438 125/72 -- -- 91 -- -- -- --  07/17/22 1433 139/71 -- -- 98 -- -- -- --  07/17/22 1428 122/74 -- -- 85 -- -- -- --  07/17/22 1423 127/71 -- -- 83 -- -- -- --  07/17/22 1418 126/71 -- -- 73 -- -- -- --  07/17/22 1413 127/67 -- -- 81 -- 99 % -- --  07/17/22 1408 129/72 -- -- 95 -- 98 % -- --  07/17/22  1402 -- -- -- -- -- 99 % -- --  07/17/22 1355 -- -- -- -- -- 98 % -- --  07/17/22 1324 133/75 -- -- 77 -- -- -- --  07/17/22 1218 (!) 141/82 98.4 F (36.9 C) Oral 89 18 -- -- --  07/17/22 1130 124/78 98.1 F (36.7 C) -- 100 16 100 % 5\' 2"  (1.575 m) 81.1 kg   Gen: calm comfortable, NAD Resp: normal effort, no distress Heart: Regular rate Abd: Soft, NT, gravid, S=D Lochia: decreasing Voiding without difficulty, ambulating without difficulty    ROS, labs, PMH reviewed  Orders Placed This Encounter  Procedures   Wet prep, genital   Culture, beta strep (group b only)   Urinalysis, Routine w reflex microscopic -Urine, Clean Catch   CBC   RPR   OB RESULTS CONSOLE RPR   OB RESULTS CONSOLE HIV antibody   OB RESULTS CONSOLE Rubella Antibody   OB RESULTS CONSOLE Hepatitis B surface antigen   Hepatitis C antibody   CBC   Diet regular Room service appropriate? Yes; Fluid consistency: Thin   Apply Labor &  Delivery Care Plan   Patient may have epidural   If the patient plans to have a postpartum tubal ligation, cover epidural catheter with non-injectable port cap and tape to patient's shoulder   Notify physician (specify) Notify Anesthesia MD immediately post-epidural infusion for: 1-The patient c/o back pain beyond tenderness at insertion site. 2-Progressively worsening motor and / or sensory loss. 3-Bowel or Bladder Incontinence 4-It ha...   Patient has an active order for admit to inpatient/place in observation   Vital signs within 30 minutes of admission to unit, Q1hr x1, Q4hr x 3, then every 8 hours until discharge   Notify physician (specify)   Activity as tolerated   Apply Postpartum (Cesarean and Vaginal Birth) Care Plan   Discontinue epidural catheter at conclusion of delivery and/or repair unless otherwise indicated by provider   Ice packs to perineum or breast    Straight catheter   Sitz bath   Breast pump, breast shells, coconut oil   K-pad    Intake and output   If RhoGAM is indicated order per protocol.   May draw maternal lab samples for Cord Blood Donation and/or Placenta Recovery Program   Initiate Oral Care Protocol   Initiate Carrier Fluid Protocol   Full code   POCT fern test   Type and screen [facility]   Epidural catheter may be discontinued following delivery if hemodynamically stable with no active bleeding and no post-partum tubal planned. Contact Anesthesiology for any complications.   Epidural catheter may be discontinued following delivery if hemodynamically stable with no active bleeding and no post-partum tubal planned. Contact Anesthesiology for any complications.   May convert IV to Saline Lock for antibiotic therapy if tolerating PO fluids well or to maintain IV site (i.e. for surgery in AM)   May discontinue IV or Saline Lock if postpartum assessment WNL   Admit to Inpatient (patient's expected length of stay will be greater than 2  midnights or inpatient only procedure)   Transfer patient   Meds ordered this encounter  Medications   DISCONTD: lactated ringers infusion   oxytocin (PITOCIN) IV BOLUS FROM BAG   DISCONTD: oxytocin (PITOCIN) IV infusion 30 units in NS 500 mL - Premix   DISCONTD: lactated ringers infusion 500-1,000 mL   DISCONTD: acetaminophen (TYLENOL) tablet 650 mg   DISCONTD: oxyCODONE-acetaminophen (PERCOCET/ROXICET) 5-325 MG per tablet 1 tablet  DISCONTD: oxyCODONE-acetaminophen (PERCOCET/ROXICET) 5-325 MG per tablet 2 tablet   DISCONTD: sodium phosphate (FLEET) 7-19 GM/118ML enema 1 enema   DISCONTD: ondansetron (ZOFRAN) injection 4 mg   DISCONTD: sodium citrate-citric acid (ORACIT) solution 30 mL   DISCONTD: lidocaine (PF) (XYLOCAINE) 1 % injection 30 mL   penicillin G potassium 5 Million Units in sodium chloride 0.9 % 250 mL IVPB    Order Specific Question:   Antibiotic Indication:    Answer:   Group B Strep Prophylaxis   DISCONTD: penicillin G potassium 3 Million Units in dextrose 50mL IVPB    Order Specific Question:   Antibiotic Indication:    Answer:   Group B Strep Prophylaxis   DISCONTD: ePHEDrine injection 10 mg   DISCONTD: PHENYLephrine 80 mcg/ml in normal saline Adult IV Push Syringe (For Blood Pressure Support)   DISCONTD: lactated ringers infusion 500 mL   DISCONTD: fentaNYL 2 mcg/mL w/ bupivacaine 0.125% in NS 250 mL epidural infusion   DISCONTD: diphenhydrAMINE (BENADRYL) injection 12.5 mg   DISCONTD: ePHEDrine injection 10 mg   DISCONTD: PHENYLephrine 80 mcg/ml in normal saline Adult IV Push Syringe (For Blood Pressure Support)   AND Linked Order Group    sodium chloride flush (NS) 0.9 % injection 3 mL    sodium chloride flush (NS) 0.9 % injection 3 mL    0.9 %  sodium chloride infusion   ibuprofen (ADVIL) tablet 600 mg   acetaminophen (TYLENOL) tablet 650 mg   zolpidem (AMBIEN) tablet 5 mg   diphenhydrAMINE (BENADRYL) capsule 25 mg   senna-docusate (Senokot-S) tablet 2  tablet   simethicone (MYLICON) chewable tablet 80 mg   OR Linked Order Group    ondansetron (ZOFRAN) tablet 4 mg    ondansetron (ZOFRAN) injection 4 mg   prenatal multivitamin tablet 1 tablet   AND Linked Order Group    witch hazel-glycerin (TUCKS) pad 1 Application    dibucaine (NUPERCAINAL) 1 % rectal ointment 1 Application   benzocaine-Menthol (DERMOPLAST) 20-0.5 % topical spray 1 Application   coconut oil    Assessment: - PPD#1 - 35 week delivery  - desires Nexplanon outpatient  - Latent TB    Plan: - DC home tomorrow - FU with HD for Nexlanon and latent TB care   Thressa Sheller DNP, CNM  07/18/22  11:18 AM

## 2022-07-18 NOTE — Lactation Note (Addendum)
This note was copied from a baby's chart. Lactation Consultation Note  Patient Name: Girl Berenize Bartha ZOXWR'U Date: 07/18/2022 Age:23 hours Reason for consult: Initial assessment;Late-preterm 34-36.6wks;Infant < 6lbs  P2, Reviewed hand expression and demonstrated how to latch in football hold.  Baby has recently breastfed for 30 min.  Assisted with formula feeding in both upright and side lying position using slow flow nipple.  Baby consumed 7 ml.  Plan: Offer breast when baby cues that she is hungry, or awaken baby for feeding at 3 hrs.  Breast feed baby, asking for help prn.  Limit to 30 mins so not to overtire baby. If baby does not latch after 10 min of attempt - give supplemental breastmilk/formula.  Slow flow nipple bottle is an option. Reviewed volume guidelines.  Brought in pump kit but will wait to review since mother will eat her breakfast first.    Feed baby 10-14 ml EBM+/formula after breastfeeding per LPTI volume guidelines increasing per day of life and as baby desires.   Returned to room and set up DEBP with 21 mm flanges and reviewed LPI feeding plan.  Suggest calling for help as needed.  [facility] referral sent.   Maternal Data Has patient been taught Hand Expression?: Yes Does the patient have breastfeeding experience prior to this delivery?: Yes How long did the patient breastfeed?: one month  Feeding Mother's Current Feeding Choice: Breast Milk and Formula  Lactation Tools Discussed/Used  DEBP  Interventions Interventions: Breast feeding basics reviewed;Education;Pace feeding;Position options  Discharge Pump: Manual;Personal  Consult Status Consult Status: Follow-up Date: 07/18/22 Follow-up type: In-patient    Dahlia Byes [facility] 07/18/2022, 9:07 AM

## 2022-07-18 NOTE — Anesthesia Postprocedure Evaluation (Signed)
Anesthesia Post Note  Patient: [name] Black  Procedure(s) Performed: AN AD HOC LABOR EPIDURAL     Patient location during evaluation: Mother Baby Anesthesia Type: Epidural Level of consciousness: awake and alert Pain management: pain level controlled Vital Signs Assessment: post-procedure vital signs reviewed and stable Respiratory status: spontaneous breathing, nonlabored ventilation and respiratory function stable Cardiovascular status: stable Postop Assessment: no headache, no backache, epidural receding, no apparent nausea or vomiting, patient able to bend at knees, able to ambulate and adequate PO intake Anesthetic complications: no   No notable events documented.  Last Vitals:  Vitals:   07/18/22 0128 07/18/22 0545  BP: 110/67 122/78  Pulse: 89 73  Resp: 16 17  Temp: 37.6 C 37.4 C  SpO2:      Last Pain:  Vitals:   07/18/22 0811  TempSrc:   PainSc: 4    Pain Goal:                   Laban Emperor

## 2022-07-19 LAB — CULTURE, BETA STREP (GROUP B ONLY)

## 2022-07-19 MED ORDER — ACETAMINOPHEN 325 MG PO TABS: 650.0000 mg | ORAL_TABLET | ORAL | 0 refills | Status: AC | PRN

## 2022-07-19 MED ORDER — BENZOCAINE-MENTHOL 20-0.5 % EX AERO: 1.0000 | INHALATION_SPRAY | CUTANEOUS | 0 refills | Status: AC | PRN

## 2022-07-19 MED ORDER — IBUPROFEN 600 MG PO TABS: 600.0000 mg | ORAL_TABLET | Freq: Four times a day (QID) | ORAL | 0 refills | Status: AC

## 2022-07-19 MED ORDER — SENNOSIDES-DOCUSATE SODIUM 8.6-50 MG PO TABS: 2.0000 | ORAL_TABLET | ORAL | 0 refills | Status: AC

## 2022-07-19 NOTE — Discharge Instructions (Signed)
WHAT TO LOOK OUT FOR: Fever of 100.4 or above Mastitis: feels like flu and breasts hurt Infection: increased pain, swelling or redness Blood clots golf ball size or larger Postpartum depression   Congratulations on your newest addition! 

## 2022-07-19 NOTE — Plan of Care (Signed)
  Problem: Education: Goal: Knowledge of General Education information will improve Description: Including pain rating scale, medication(s)/side effects and non-pharmacologic comfort measures Outcome: Adequate for Discharge   Problem: Health Behavior/Discharge Planning: Goal: Ability to manage health-related needs will improve Outcome: Adequate for Discharge   Problem: Clinical Measurements: Goal: Ability to maintain clinical measurements within normal limits will improve Outcome: Adequate for Discharge Goal: Will remain free from infection Outcome: Adequate for Discharge Goal: Diagnostic test results will improve Outcome: Adequate for Discharge Goal: Respiratory complications will improve Outcome: Adequate for Discharge Goal: Cardiovascular complication will be avoided Outcome: Adequate for Discharge   Problem: Activity: Goal: Risk for activity intolerance will decrease Outcome: Adequate for Discharge   Problem: Nutrition: Goal: Adequate nutrition will be maintained Outcome: Adequate for Discharge   Problem: Coping: Goal: Level of anxiety will decrease Outcome: Adequate for Discharge   Problem: Elimination: Goal: Will not experience complications related to bowel motility Outcome: Adequate for Discharge Goal: Will not experience complications related to urinary retention Outcome: Adequate for Discharge   Problem: Pain Managment: Goal: General experience of comfort will improve Outcome: Adequate for Discharge   Problem: Safety: Goal: Ability to remain free from injury will improve Outcome: Adequate for Discharge   Problem: Skin Integrity: Goal: Risk for impaired skin integrity will decrease Outcome: Adequate for Discharge   Problem: Education: Goal: Knowledge of condition will improve Outcome: Adequate for Discharge   Problem: Activity: Goal: Will verbalize the importance of balancing activity with adequate rest periods Outcome: Adequate for Discharge Goal:  Ability to tolerate increased activity will improve Outcome: Adequate for Discharge   Problem: Coping: Goal: Ability to identify and utilize available resources and services will improve Outcome: Adequate for Discharge   Problem: Life Cycle: Goal: Chance of risk for complications during the postpartum period will decrease Outcome: Adequate for Discharge   Problem: Skin Integrity: Goal: Demonstration of wound healing without infection will improve Outcome: Adequate for Discharge

## 2022-07-20 ENCOUNTER — Ambulatory Visit (HOSPITAL_COMMUNITY): Payer: Self-pay

## 2022-07-20 NOTE — Lactation Note (Signed)
This note was copied from a baby's chart. Lactation Consultation Note  Patient Name: [name] Black [id number] Date: 07/20/2022 Age:23 hours Followup , LPT , Less than 6 pounds , gained weight since yesterday LC reviewed and updated the doc flow sheets with mom.  Per mom the baby latches and stays latched for 5 mins and if off.  LC to enhance the flow prior to latching - steps for latching,  Breast massage, hand express, pre - pump 10 -20 strokes and reverse pressure . Use the football or cross cradle . If the baby is sluggish to start feed her an appetizer of EBM or formula 10 ml and then latch.  Mom is not a candidate for a Stork pump and LC sent a referral to [facility] [place] for a DEBP loaner. Per mom will be calling tomorrow am.  Mom aware to take the kit/  Maternal Data    Feeding Mother's Current Feeding Choice: Breast Milk and Formula Nipple Type: Slow - flow (Simultaneous filing. User may not have seen previous data.)  LATCH Score - per mom baby has latched but only feeding for 5 min and releases   Lactation Tools Discussed/Used Tools: Pump;Flanges Flange Size: 21 Breast pump type: Double-Electric Breast Pump;Manual Pump Education: Milk Storage  Interventions  Education   Discharge Discharge Education: Engorgement and breast care;Warning signs for feeding baby Pump: Manual WIC Program: Yes  Consult Status Consult Status: Complete Date: 07/20/22    Kathrin Greathouse 07/20/2022, 10:06 AM

## 2022-07-28 ENCOUNTER — Telehealth (HOSPITAL_COMMUNITY): Payer: Self-pay | Admitting: *Deleted

## 2022-07-28 NOTE — Telephone Encounter (Signed)
Patient voiced no questions or concerns regarding her health at this time. EPDS=0. Patient voiced no questions or concerns regarding infant at this time. RN reviewed ABCs of safe sleep. Patient verbalized understanding. Patient requested RN email information on hospital's breastfeeding and pumping support groups. Email sent. Deforest Hoyles, RN, 07/28/22, [phone]
